# Patient Record
Sex: Female | Born: 1960 | Race: White | Hispanic: No | Marital: Married | Smoking: Never smoker
Health system: Southern US, Community
[De-identification: ages and names within clinical notes are randomized; demographics above are authoritative.]

## PROBLEM LIST (undated history)

## (undated) DIAGNOSIS — K589 Irritable bowel syndrome without diarrhea: Secondary | ICD-10-CM

## (undated) DIAGNOSIS — D219 Benign neoplasm of connective and other soft tissue, unspecified: Secondary | ICD-10-CM

## (undated) DIAGNOSIS — E119 Type 2 diabetes mellitus without complications: Secondary | ICD-10-CM

## (undated) DIAGNOSIS — Z8669 Personal history of other diseases of the nervous system and sense organs: Secondary | ICD-10-CM

## (undated) DIAGNOSIS — T7840XA Allergy, unspecified, initial encounter: Secondary | ICD-10-CM

## (undated) DIAGNOSIS — I10 Essential (primary) hypertension: Secondary | ICD-10-CM

## (undated) DIAGNOSIS — M549 Dorsalgia, unspecified: Secondary | ICD-10-CM

## (undated) DIAGNOSIS — M542 Cervicalgia: Secondary | ICD-10-CM

## (undated) DIAGNOSIS — G5603 Carpal tunnel syndrome, bilateral upper limbs: Secondary | ICD-10-CM

## (undated) DIAGNOSIS — K635 Polyp of colon: Secondary | ICD-10-CM

## (undated) HISTORY — DX: Dorsalgia, unspecified: M54.9

## (undated) HISTORY — DX: Allergy, unspecified, initial encounter: T78.40XA

## (undated) HISTORY — DX: Type 2 diabetes mellitus without complications: E11.9

## (undated) HISTORY — PX: TUBAL LIGATION: SHX77

## (undated) HISTORY — DX: Cervicalgia: M54.2

## (undated) HISTORY — DX: Benign neoplasm of connective and other soft tissue, unspecified: D21.9

## (undated) HISTORY — DX: Polyp of colon: K63.5

## (undated) HISTORY — DX: Irritable bowel syndrome, unspecified: K58.9

## (undated) HISTORY — PX: MRI: SHX5353

## (undated) HISTORY — DX: Essential (primary) hypertension: I10

## (undated) HISTORY — DX: Carpal tunnel syndrome, bilateral upper limbs: G56.03

## (undated) HISTORY — DX: Personal history of other diseases of the nervous system and sense organs: Z86.69

---

## 2003-11-28 ENCOUNTER — Encounter: Payer: Self-pay | Admitting: Family Medicine

## 2003-12-29 ENCOUNTER — Emergency Department (HOSPITAL_COMMUNITY): Admission: EM | Admit: 2003-12-29 | Discharge: 2003-12-29 | Payer: Self-pay | Admitting: Emergency Medicine

## 2005-02-19 ENCOUNTER — Ambulatory Visit: Payer: Self-pay | Admitting: Family Medicine

## 2005-02-28 ENCOUNTER — Ambulatory Visit: Payer: Self-pay | Admitting: Internal Medicine

## 2005-03-07 ENCOUNTER — Ambulatory Visit: Payer: Self-pay | Admitting: Family Medicine

## 2005-03-21 ENCOUNTER — Ambulatory Visit: Payer: Self-pay | Admitting: Internal Medicine

## 2005-03-28 ENCOUNTER — Encounter (INDEPENDENT_AMBULATORY_CARE_PROVIDER_SITE_OTHER): Payer: Self-pay | Admitting: *Deleted

## 2005-03-28 ENCOUNTER — Ambulatory Visit: Payer: Self-pay | Admitting: Internal Medicine

## 2005-08-14 ENCOUNTER — Ambulatory Visit: Payer: Self-pay | Admitting: Family Medicine

## 2005-11-14 ENCOUNTER — Ambulatory Visit: Payer: Self-pay | Admitting: Family Medicine

## 2007-07-20 ENCOUNTER — Encounter: Payer: Self-pay | Admitting: Family Medicine

## 2007-07-20 DIAGNOSIS — G43909 Migraine, unspecified, not intractable, without status migrainosus: Secondary | ICD-10-CM | POA: Insufficient documentation

## 2007-07-20 DIAGNOSIS — K589 Irritable bowel syndrome without diarrhea: Secondary | ICD-10-CM | POA: Insufficient documentation

## 2007-07-21 ENCOUNTER — Ambulatory Visit: Payer: Self-pay | Admitting: Family Medicine

## 2007-07-21 DIAGNOSIS — M546 Pain in thoracic spine: Secondary | ICD-10-CM | POA: Insufficient documentation

## 2007-07-21 DIAGNOSIS — M542 Cervicalgia: Secondary | ICD-10-CM | POA: Insufficient documentation

## 2007-09-21 ENCOUNTER — Telehealth (INDEPENDENT_AMBULATORY_CARE_PROVIDER_SITE_OTHER): Payer: Self-pay | Admitting: *Deleted

## 2008-01-13 ENCOUNTER — Ambulatory Visit: Payer: Self-pay | Admitting: Family Medicine

## 2008-01-13 DIAGNOSIS — K3189 Other diseases of stomach and duodenum: Secondary | ICD-10-CM | POA: Insufficient documentation

## 2008-01-13 DIAGNOSIS — R1013 Epigastric pain: Secondary | ICD-10-CM

## 2008-07-07 ENCOUNTER — Ambulatory Visit: Payer: Self-pay | Admitting: Family Medicine

## 2008-07-07 DIAGNOSIS — E785 Hyperlipidemia, unspecified: Secondary | ICD-10-CM | POA: Insufficient documentation

## 2008-07-21 ENCOUNTER — Ambulatory Visit: Payer: Self-pay | Admitting: Family Medicine

## 2008-07-24 LAB — CONVERTED CEMR LAB
ALT: 49 units/L — ABNORMAL HIGH (ref 0–35)
Basophils Absolute: 0 10*3/uL (ref 0.0–0.1)
Basophils Relative: 0.1 % (ref 0.0–3.0)
Bilirubin, Direct: 0.1 mg/dL (ref 0.0–0.3)
CO2: 31 meq/L (ref 19–32)
Calcium: 9.4 mg/dL (ref 8.4–10.5)
Chloride: 104 meq/L (ref 96–112)
Cholesterol: 235 mg/dL (ref 0–200)
GFR calc Af Amer: 76 mL/min
Glucose, Bld: 108 mg/dL — ABNORMAL HIGH (ref 70–99)
HCT: 41.3 % (ref 36.0–46.0)
Lymphocytes Relative: 42.8 % (ref 12.0–46.0)
Neutro Abs: 3.2 10*3/uL (ref 1.4–7.7)
RBC: 4.59 M/uL (ref 3.87–5.11)
Sodium: 141 meq/L (ref 135–145)
TSH: 2.6 microintl units/mL (ref 0.35–5.50)
Triglycerides: 150 mg/dL — ABNORMAL HIGH (ref 0–149)
VLDL: 30 mg/dL (ref 0–40)
WBC: 6.5 10*3/uL (ref 4.5–10.5)

## 2008-08-11 ENCOUNTER — Ambulatory Visit: Payer: Self-pay | Admitting: Family Medicine

## 2008-08-11 DIAGNOSIS — E6609 Other obesity due to excess calories: Secondary | ICD-10-CM | POA: Insufficient documentation

## 2008-08-11 DIAGNOSIS — E66812 Obesity, class 2: Secondary | ICD-10-CM | POA: Insufficient documentation

## 2008-08-11 DIAGNOSIS — R002 Palpitations: Secondary | ICD-10-CM | POA: Insufficient documentation

## 2008-08-11 DIAGNOSIS — E669 Obesity, unspecified: Secondary | ICD-10-CM

## 2008-08-11 DIAGNOSIS — E66811 Obesity, class 1: Secondary | ICD-10-CM | POA: Insufficient documentation

## 2008-08-11 DIAGNOSIS — K219 Gastro-esophageal reflux disease without esophagitis: Secondary | ICD-10-CM | POA: Insufficient documentation

## 2008-10-25 ENCOUNTER — Ambulatory Visit: Payer: Self-pay | Admitting: Family Medicine

## 2008-10-26 LAB — CONVERTED CEMR LAB
AST: 35 units/L (ref 0–37)
HDL: 37.3 mg/dL — ABNORMAL LOW (ref >39.0)
Triglycerides: 154 mg/dL — ABNORMAL HIGH (ref 0–149)

## 2008-11-01 ENCOUNTER — Ambulatory Visit: Payer: Self-pay | Admitting: Family Medicine

## 2008-11-01 DIAGNOSIS — M25569 Pain in unspecified knee: Secondary | ICD-10-CM | POA: Insufficient documentation

## 2008-11-01 DIAGNOSIS — I10 Essential (primary) hypertension: Secondary | ICD-10-CM | POA: Insufficient documentation

## 2008-11-29 ENCOUNTER — Encounter: Admission: RE | Admit: 2008-11-29 | Discharge: 2008-11-29 | Payer: Self-pay | Admitting: Family Medicine

## 2008-11-29 ENCOUNTER — Ambulatory Visit: Payer: Self-pay | Admitting: Family Medicine

## 2008-11-29 DIAGNOSIS — IMO0002 Reserved for concepts with insufficient information to code with codable children: Secondary | ICD-10-CM | POA: Insufficient documentation

## 2008-11-30 ENCOUNTER — Encounter (INDEPENDENT_AMBULATORY_CARE_PROVIDER_SITE_OTHER): Payer: Self-pay | Admitting: *Deleted

## 2008-12-25 ENCOUNTER — Ambulatory Visit: Payer: Self-pay | Admitting: Family Medicine

## 2009-01-04 LAB — CONVERTED CEMR LAB
ALT: 56 units/L — ABNORMAL HIGH (ref 0–35)
Albumin: 4.1 g/dL (ref 3.5–5.2)
Bilirubin, Direct: 0.1 mg/dL (ref 0.0–0.3)
Direct LDL: 175.2 mg/dL
Total CHOL/HDL Ratio: 7
Triglycerides: 189 mg/dL — ABNORMAL HIGH (ref 0.0–149.0)

## 2009-01-12 ENCOUNTER — Telehealth: Payer: Self-pay | Admitting: Family Medicine

## 2009-01-15 ENCOUNTER — Ambulatory Visit: Payer: Self-pay | Admitting: Family Medicine

## 2009-01-22 ENCOUNTER — Encounter: Payer: Self-pay | Admitting: Family Medicine

## 2009-02-01 ENCOUNTER — Ambulatory Visit: Payer: Self-pay | Admitting: Family Medicine

## 2009-02-02 LAB — CONVERTED CEMR LAB
AST: 31 units/L (ref 0–37)
Cholesterol: 162 mg/dL (ref 0–200)
LDL Cholesterol: 87 mg/dL (ref 0–99)
Total CHOL/HDL Ratio: 5

## 2009-02-05 ENCOUNTER — Ambulatory Visit: Payer: Self-pay | Admitting: Family Medicine

## 2009-03-12 ENCOUNTER — Encounter: Payer: Self-pay | Admitting: Family Medicine

## 2009-05-08 ENCOUNTER — Ambulatory Visit: Payer: Self-pay | Admitting: Family Medicine

## 2009-05-10 LAB — CONVERTED CEMR LAB
AST: 27 units/L (ref 0–37)
HDL: 36.2 mg/dL — ABNORMAL LOW (ref >39.00)
LDL Cholesterol: 93 mg/dL (ref 0–99)

## 2009-11-29 ENCOUNTER — Ambulatory Visit: Payer: Self-pay | Admitting: Family Medicine

## 2009-11-30 LAB — CONVERTED CEMR LAB
ALT: 47 units/L — ABNORMAL HIGH (ref 0–35)
Cholesterol: 163 mg/dL (ref 0–200)
HDL: 47 mg/dL (ref >39.00)
LDL Cholesterol: 79 mg/dL (ref 0–99)
Total CHOL/HDL Ratio: 3

## 2010-01-21 ENCOUNTER — Telehealth: Payer: Self-pay | Admitting: Family Medicine

## 2010-03-07 ENCOUNTER — Encounter (INDEPENDENT_AMBULATORY_CARE_PROVIDER_SITE_OTHER): Payer: Self-pay | Admitting: *Deleted

## 2010-10-29 NOTE — Letter (Signed)
Summary: Colonoscopy Letter  [facility]  [address] [place], [place] [address]   Phone: [phone]  Fax: [phone]      March 07, 2010 MRN: [id number]   GRACIANA SESSA [address] [place], [place]  [address]   Dear Ms. FOSTER,   According to your medical record, it is time for you to schedule a Colonoscopy. The American Cancer Society recommends this procedure as a method to detect early colon cancer. Patients with a family history of colon cancer, or a personal history of colon polyps or inflammatory bowel disease are at increased risk.  This letter has beeen generated based on the recommendations made at the time of your procedure. If you feel that in your particular situation this may no longer apply, please contact our office.  Please call our office at [phone] to schedule this appointment or to update your records at your earliest convenience.  Thank you for cooperating with [place] to provide you with the very best care possible.   Sincerely,    Iva Boop, M.D.  [facility] [phone]

## 2010-10-29 NOTE — Progress Notes (Signed)
Summary: Imipramine 25mg  and Gabapentin 300mg  refill  Phone Note Refill Request Call back at [phone] Message from:  [facility] on January 21, 2010 9:48 AM  Refills Requested: Medication #1:  IMIPRAMINE HCL 25 MG  TABS 2 by mouth at bedtime   Last Refilled: 12/27/2009  Medication #2:  GABAPENTIN 300 MG  CAPS 2 by mouth three times a day.   Last Refilled: 12/14/2009 [facility] request refills electronically for above meds. Please advise.    Method Requested: Telephone to Pharmacy Initial call taken by: Lewanda Rife LPN,  January 21, 2010 9:49 AM  Follow-up for Phone Call        px written on EMR for call in  Follow-up by: Judith Part MD,  January 21, 2010 12:45 PM  Additional Follow-up for Phone Call Additional follow up Details #1::        Medication phoned to [facility] pharmacy as instructed. Lewanda Rife LPN  January 21, 2010 12:58 PM     New/Updated Medications: IMIPRAMINE HCL 25 MG  TABS (IMIPRAMINE HCL) 2 by mouth at bedtime GABAPENTIN 300 MG  CAPS (GABAPENTIN) 2 by mouth three times a day Prescriptions: GABAPENTIN 300 MG  CAPS (GABAPENTIN) 2 by mouth three times a day  #180 x 11   Entered and Authorized by:   Judith Part MD   Signed by:   Lewanda Rife LPN on 45/40/9811   Method used:   Telephoned to ...       [facility] [address] [address] [phone]* (retail)       [address] [place], [place]  [id number]       Ph: [phone]       Fax: [phone]   RxID:   [id number] IMIPRAMINE HCL 25 MG  TABS (IMIPRAMINE HCL) 2 by mouth at bedtime  #60 x 11   Entered and Authorized by:   Judith Part MD   Signed by:   Lewanda Rife LPN on 25/95/6387   Method used:   Telephoned to ...       [facility] [address] [phone]* (retail)       [address] [place], [place]  [id number]       Ph: [phone]       Fax: [phone]   RxID:   [id number]

## 2011-01-23 ENCOUNTER — Other Ambulatory Visit: Payer: Self-pay | Admitting: Family Medicine

## 2011-01-24 NOTE — Telephone Encounter (Signed)
Pt needs to call for appt. 

## 2011-02-26 ENCOUNTER — Other Ambulatory Visit: Payer: Self-pay | Admitting: Family Medicine

## 2011-02-26 NOTE — Telephone Encounter (Signed)
Medication phoned to [facility] [address] pharmacy as instructed.

## 2011-02-26 NOTE — Telephone Encounter (Signed)
Px written for call in   

## 2011-03-29 ENCOUNTER — Encounter: Payer: Self-pay | Admitting: Family Medicine

## 2011-03-31 ENCOUNTER — Encounter: Payer: Self-pay | Admitting: Family Medicine

## 2011-03-31 ENCOUNTER — Ambulatory Visit (INDEPENDENT_AMBULATORY_CARE_PROVIDER_SITE_OTHER): Payer: 59 | Admitting: Family Medicine

## 2011-03-31 DIAGNOSIS — L259 Unspecified contact dermatitis, unspecified cause: Secondary | ICD-10-CM

## 2011-03-31 DIAGNOSIS — I10 Essential (primary) hypertension: Secondary | ICD-10-CM

## 2011-03-31 DIAGNOSIS — K219 Gastro-esophageal reflux disease without esophagitis: Secondary | ICD-10-CM

## 2011-03-31 DIAGNOSIS — L309 Dermatitis, unspecified: Secondary | ICD-10-CM | POA: Insufficient documentation

## 2011-03-31 DIAGNOSIS — E785 Hyperlipidemia, unspecified: Secondary | ICD-10-CM

## 2011-03-31 LAB — CBC WITH DIFFERENTIAL/PLATELET
Basophils Absolute: 0 10*3/uL (ref 0.0–0.1)
Basophils Relative: 0.2 % (ref 0.0–3.0)
Eosinophils Absolute: 0 10*3/uL (ref 0.0–0.7)
HCT: 42.1 % (ref 36.0–46.0)
Hemoglobin: 14.4 g/dL (ref 12.0–15.0)
Lymphocytes Relative: 42.1 % (ref 12.0–46.0)
Lymphs Abs: 2.9 10*3/uL (ref 0.7–4.0)
MCV: 91.1 fl (ref 78.0–100.0)
Monocytes Absolute: 0.5 10*3/uL (ref 0.1–1.0)
RDW: 13 % (ref 11.5–14.6)
WBC: 6.8 10*3/uL (ref 4.5–10.5)

## 2011-03-31 LAB — LIPID PANEL
HDL: 42.7 mg/dL (ref >39.00)
Total CHOL/HDL Ratio: 5
Triglycerides: 312 mg/dL — ABNORMAL HIGH (ref 0.0–149.0)
VLDL: 62.4 mg/dL — ABNORMAL HIGH (ref 0.0–40.0)

## 2011-03-31 LAB — COMPREHENSIVE METABOLIC PANEL
Alkaline Phosphatase: 86 U/L (ref 39–117)
GFR: 70.21 mL/min (ref >60.00)
Sodium: 141 mEq/L (ref 135–145)
Total Protein: 7.3 g/dL (ref 6.0–8.3)

## 2011-03-31 LAB — TSH: TSH: 2.72 u[IU]/mL (ref 0.35–5.50)

## 2011-03-31 MED ORDER — OMEPRAZOLE 20 MG PO CPDR: 20.0000 mg | DELAYED_RELEASE_CAPSULE | Freq: Every day | ORAL | Status: DC

## 2011-03-31 MED ORDER — MOMETASONE FUROATE 0.1 % EX CREA: TOPICAL_CREAM | Freq: Every day | CUTANEOUS | Status: AC

## 2011-03-31 MED ORDER — LISINOPRIL-HYDROCHLOROTHIAZIDE 10-12.5 MG PO TABS: 1.0000 | ORAL_TABLET | ORAL | Status: DC

## 2011-03-31 MED ORDER — IMIPRAMINE HCL 25 MG PO TABS: 50.0000 mg | ORAL_TABLET | Freq: Every day | ORAL | Status: DC

## 2011-03-31 MED ORDER — GABAPENTIN 300 MG PO CAPS: 600.0000 mg | ORAL_CAPSULE | Freq: Three times a day (TID) | ORAL | Status: DC

## 2011-03-31 NOTE — Progress Notes (Signed)
Subjective:    Patient ID: Jo Lamb, female    DOB: 10-27-1960, 50 y.o.   MRN: [id number]  HPI Rash on hands -- thought at first it was poison oak  Used product otc - calamine lotion and cortisone 10 plus/ aaveno ezcema lotion No better and no worse  Skin is rough  Little bumps come up and -rough skin in general   No exp to scabies known  No travel Not intensely itchy   Stopped her chol med on purpose  Her family members had trouble with it - though she had none   bp isgood at 130/82 No problems  No cp or edema or palpitations   Needs chol checked  Is trying to eat healthy -- eats chicken breast and veg a lot  occ bacon and egg - on english muffin - this is her downfall  Patient Active Problem List  Diagnoses  . UNSPEC DISORDER CARBOHYDRATE TRANSPORT&METAB  . HYPERLIPIDEMIA  . OBESITY  . MIGRAINE HEADACHE  . ESSENTIAL HYPERTENSION  . GERD  . DYSPEPSIA  . IBS  . KNEE PAIN, BILATERAL  . NECK PAIN, CHRONIC  . BACK PAIN, THORACIC REGION  . BACK PAIN WITH RADICULOPATHY  . PALPITATIONS  . Hand dermatitis   Past Medical History  Diagnosis Date  . Colon polyps     colonoscopy- polyps, hemorrhoids, divertic  . History of migraine headaches   . Back pain   . Neck pain   . Hypertension     White coat  . IBS (irritable bowel syndrome)   . Fibroids   . Carpal tunnel syndrome on both sides 02/2004// 10/22/2004    Nerve conduction study x's 2   Past Surgical History  Procedure Date  . Vaginal delivery     x's two  . Tubal ligation   . Mri     MRI of head// Negative   History  Substance Use Topics  . Smoking status: Never Smoker   . Smokeless tobacco: Not on file  . Alcohol Use: No   Family History  Problem Relation Age of Onset  . Hypertension Father    No Known Allergies Current Outpatient Prescriptions on File Prior to Visit  Medication Sig Dispense Refill  . Omega-3 Fatty Acids (FISH OIL) 1000 MG CAPS Take 1 capsule by mouth daily.                Review of Systems Review of Systems  Constitutional: Negative for fever, appetite change, fatigue and unexpected weight change.  Eyes: Negative for pain and visual disturbance.  Respiratory: Negative for cough and shortness of breath.   Cardiovascular: Negative.  no cp or sob or edema  Gastrointestinal: Negative for nausea, diarrhea and constipation.  Genitourinary: Negative for urgency and frequency.  Skin: Negative for pallor. pos for itchy rash Neurological: Negative for weakness, light-headedness, numbness and headaches.  Hematological: Negative for adenopathy. Does not bruise/bleed easily.  Psychiatric/Behavioral: Negative for dysphoric mood. The patient is not nervous/anxious.         Objective:   Physical Exam  Constitutional: She appears well-developed and well-nourished. No distress.  HENT:  Head: Normocephalic and atraumatic.  Mouth/Throat: Oropharynx is clear and moist.  Eyes: Conjunctivae and EOM are normal. Pupils are equal, round, and reactive to light.  Neck: Normal range of motion. Neck supple. No JVD present. Carotid bruit is not present. No thyromegaly present.  Cardiovascular: Normal rate, regular rhythm and normal heart sounds.   Pulmonary/Chest: Effort normal and breath sounds normal.  No respiratory distress. She has no rales.  Abdominal: Soft. Bowel sounds are normal. She exhibits no distension and no mass. There is no tenderness.  Musculoskeletal: Normal range of motion. She exhibits no edema and no tenderness.  Lymphadenopathy:    She has no cervical adenopathy.  Neurological: She is alert. She has normal reflexes. She displays normal reflexes. No cranial nerve deficit.  Skin: Skin is warm and dry. No rash noted. No erythema.       Rash on proximal thumbs - sparing web spaces/ and palms- dry and flaky with slt erythema  No vesicles  Psychiatric: She has a normal mood and affect.          Assessment & Plan:

## 2011-03-31 NOTE — Assessment & Plan Note (Signed)
Mainly on inner thumbs Resembles eczema  Disc soap - gentle / avoid hot water Trial of elocon cream and update

## 2011-03-31 NOTE — Assessment & Plan Note (Signed)
Better on 2nd check today and well controlled at home refil med Lab today Disc exercise and need for wt loss

## 2011-03-31 NOTE — Assessment & Plan Note (Signed)
Pt decided to stop zocor out of fear of all chol meds  Lab today Diet fair Disc low sat fat diet in detail

## 2011-03-31 NOTE — Patient Instructions (Signed)
Labs today  No change in medicines  Use the elocon cream on hands - and update me in 2 weeks if not improving  Try dishwashing gloves " true blue" Use dove soap for sensitive skin  Use lubriderm or eucerin moisturizer

## 2011-04-01 LAB — LDL CHOLESTEROL, DIRECT: Direct LDL: 151.2 mg/dL

## 2011-04-10 ENCOUNTER — Telehealth: Payer: Self-pay

## 2011-04-12 NOTE — Telephone Encounter (Signed)
Opened in error

## 2011-04-16 ENCOUNTER — Encounter: Payer: Self-pay | Admitting: Family Medicine

## 2011-04-16 ENCOUNTER — Ambulatory Visit (INDEPENDENT_AMBULATORY_CARE_PROVIDER_SITE_OTHER): Payer: 59 | Admitting: Family Medicine

## 2011-04-16 VITALS — BP 130/78 | HR 84 | Temp 98.5°F | Wt 161.4 lb

## 2011-04-16 DIAGNOSIS — J029 Acute pharyngitis, unspecified: Secondary | ICD-10-CM

## 2011-04-16 LAB — POCT RAPID STREP A (OFFICE): Rapid Strep A Screen: NEGATIVE

## 2011-04-16 NOTE — Progress Notes (Signed)
  Subjective:    Patient ID: Jo Lamb, female    DOB: [birth year], 50 y.o.   MRN: [id number]  HPI CC: ST  1 wk h/o ST worse yesterday than today, but feeling malaise.  R sided, pain down neck on inside.  Got worse yesterday.  This am had golden/brown sputum from cough but no more.  + mild HA but tends to get headaches.  Mild sinus pressure right nasal.  Did have white spot in throat yesterday.  No cough, fevers/chills, abd pain, n/v.  Denies sinus congestion  No sick contacts at home, no smokers at home, no asthma, COPD.  Review of Systems Per HPI    Objective:   Physical Exam  Vitals reviewed. Constitutional: She appears well-developed and well-nourished. No distress.  HENT:  Head: Normocephalic and atraumatic.  Right Ear: Hearing, tympanic membrane, external ear and ear canal normal.  Left Ear: Hearing, tympanic membrane, external ear and ear canal normal.  Nose: No mucosal edema or rhinorrhea. Right sinus exhibits no maxillary sinus tenderness and no frontal sinus tenderness. Left sinus exhibits no maxillary sinus tenderness and no frontal sinus tenderness.  Mouth/Throat: Uvula is midline and mucous membranes are normal. Posterior oropharyngeal erythema present. No oropharyngeal exudate, posterior oropharyngeal edema or tonsillar abscesses.       R maxillary sinus pressure  Eyes: Conjunctivae and EOM are normal. Pupils are equal, round, and reactive to light. No scleral icterus.  Neck: Normal range of motion. Neck supple.  Cardiovascular: Normal rate, regular rhythm, normal heart sounds and intact distal pulses.   No murmur heard. Pulmonary/Chest: Effort normal and breath sounds normal. No respiratory distress. She has no wheezes. She has no rales.  Lymphadenopathy:    She has no cervical adenopathy.  Skin: Skin is warm and dry. No rash noted.          Assessment & Plan:

## 2011-04-16 NOTE — Patient Instructions (Signed)
You have pharyngitis, viral. Push fluids and plenty of rest. May use ibuprofen for throat inflammation. Salt water gargles. Suck on cold things like popsicles or warm things like herbal teas (whichever soothes the throat better). Return if fever >101.5, worsening pain, or trouble opening/closing mouth, or hoarse voice. Good to see you today, call clinic with questions. 

## 2011-07-08 ENCOUNTER — Telehealth: Payer: Self-pay | Admitting: Gastroenterology

## 2011-07-08 NOTE — Telephone Encounter (Signed)
Talked with patient about scheduling her Colonoscopy. Patient stated that her father died not to long ago and that she just went back to work and she could not afford to get off of work right now but she would call back to schedule Colon when she could.

## 2011-09-29 ENCOUNTER — Telehealth: Payer: Self-pay | Admitting: Internal Medicine

## 2011-09-29 NOTE — Telephone Encounter (Signed)
Patient notified as instructed by telephone. 

## 2011-09-29 NOTE — Telephone Encounter (Signed)
Needs to go to urgent care if we are full-esp since tomorrow is a holiday

## 2011-09-29 NOTE — Telephone Encounter (Signed)
Patient thinks she has an UTI with burning and blood, urgency to go.  No appointment available would like to know if you could call in something or if she could drop off a specimen or should she go to urgent care.  Please advise.

## 2011-10-01 ENCOUNTER — Telehealth: Payer: Self-pay

## 2011-10-01 NOTE — Telephone Encounter (Signed)
Pt advised 09/29/11 to go to urgent care possible UTI. Pt said UC was too expensive and has been taking OTC med so burning upon urination is better but not gone. Pt wants appt only with Dr Milinda Antis. Scheduled 10/03/11 at 3:15. Pt instructed to call back if symptoms worsen before seen.

## 2011-10-03 ENCOUNTER — Encounter: Payer: Self-pay | Admitting: Family Medicine

## 2011-10-03 ENCOUNTER — Ambulatory Visit (INDEPENDENT_AMBULATORY_CARE_PROVIDER_SITE_OTHER): Payer: 59 | Admitting: Family Medicine

## 2011-10-03 VITALS — BP 140/90 | HR 88 | Temp 97.9°F | Ht 58.5 in | Wt 160.2 lb

## 2011-10-03 DIAGNOSIS — R350 Frequency of micturition: Secondary | ICD-10-CM

## 2011-10-03 DIAGNOSIS — R300 Dysuria: Secondary | ICD-10-CM | POA: Insufficient documentation

## 2011-10-03 DIAGNOSIS — Z23 Encounter for immunization: Secondary | ICD-10-CM

## 2011-10-03 LAB — POCT URINALYSIS DIPSTICK
Bilirubin, UA: NEGATIVE
Ketones, UA: NEGATIVE
Spec Grav, UA: 1.015
Urobilinogen, UA: 0.2

## 2011-10-03 LAB — POCT UA - MICROSCOPIC ONLY
Bacteria, U Microscopic: 0
WBC, Ur, HPF, POC: 0

## 2011-10-03 NOTE — Assessment & Plan Note (Signed)
With neg ua and dip today No vaginal symptoms  Puzzling symptoms - ? If caffeine could have caused some bladder or urethral irritation Adv fluids- water only  Avoid baths See AVS cx urine and update  If neg - and symptoms continue - would consider urology eval

## 2011-10-03 NOTE — Patient Instructions (Signed)
Drink lots of water Hold off on other beverages - esp soda/tea/ coffee  Avoid bubble baths  Azo is ok if it helps If symptoms get severe over the weekend call this number to talk to a nurse I will inform you as soon as culture returns  Flu shot today

## 2011-10-03 NOTE — Progress Notes (Signed)
Subjective:    Patient ID: Jo Lamb, female    DOB: 1961/01/01, 51 y.o.   MRN: [id number]  HPI Here for urinary symptoms  Started a week before xmas- a little burning to urinate  Then vomited once in the middle of the night Got some better Friday - had much worse pain and a little blood in urine  Got better and then worse Sunday again  Azo helped a bit - did not use today   Not feeling bad today  Not any frequency or urgency today  Drinking lots of water and cranberry juice   No fever  A little nausea yesterday (had skipped lunch )   No severe back or flank pain    ua is neg dip and spin today No hx kidney stones   (there is in family)  Patient Active Problem List  Diagnoses  . UNSPEC DISORDER CARBOHYDRATE TRANSPORT&METAB  . HYPERLIPIDEMIA  . OBESITY  . MIGRAINE HEADACHE  . ESSENTIAL HYPERTENSION  . GERD  . DYSPEPSIA  . IBS  . KNEE PAIN, BILATERAL  . NECK PAIN, CHRONIC  . BACK PAIN, THORACIC REGION  . BACK PAIN WITH RADICULOPATHY  . PALPITATIONS  . Hand dermatitis   Past Medical History  Diagnosis Date  . Colon polyps     colonoscopy- polyps, hemorrhoids, divertic  . History of migraine headaches   . Back pain   . Neck pain   . Hypertension     White coat  . IBS (irritable bowel syndrome)   . Fibroids   . Carpal tunnel syndrome on both sides 02/2004// 10/22/2004    Nerve conduction study x's 2   Past Surgical History  Procedure Date  . Vaginal delivery     x's two  . Tubal ligation   . Mri     MRI of head// Negative   History  Substance Use Topics  . Smoking status: Never Smoker   . Smokeless tobacco: Not on file  . Alcohol Use: No   Family History  Problem Relation Age of Onset  . Hypertension Father    No Known Allergies Current Outpatient Prescriptions on File Prior to Visit  Medication Sig Dispense Refill  . B Complex Vitamins LIQD Place 1 mL under the tongue daily.        Marland Kitchen gabapentin (NEURONTIN) 300 MG capsule Take 2  capsules (600 mg total) by mouth 3 (three) times daily.  180 capsule  11  . imipramine (TOFRANIL) 25 MG tablet Take 2 tablets (50 mg total) by mouth at bedtime.  60 tablet  11  . lisinopril-hydrochlorothiazide (PRINZIDE,ZESTORETIC) 10-12.5 MG per tablet Take 1 tablet by mouth every morning.  30 tablet  11  . mometasone (ELOCON) 0.1 % cream Apply topically daily. As needed for hand rash  45 g  0  . Omega-3 Fatty Acids (FISH OIL) 1000 MG CAPS Take 1 capsule by mouth daily.        Marland Kitchen omeprazole (PRILOSEC) 20 MG capsule Take 1 capsule (20 mg total) by mouth daily. 1 by mouth each am about 30 minutes before breakfast  30 capsule  11  . riboflavin (VITAMIN B-2) 100 MG TABS Take 100 mg by mouth daily.            Review of Systems Review of Systems  Constitutional: Negative for fever, appetite change, fatigue and unexpected weight change.  Eyes: Negative for pain and visual disturbance.  Respiratory: Negative for cough and shortness of breath.   Cardiovascular: Negative for  cp or palpitations    Gastrointestinal: Negative for nausea, diarrhea and constipation.  Genitourinary: was pos for urgency/freq/ dysuria (but negative for those symptoms today) Skin: Negative for pallor or rash   Neurological: Negative for weakness, light-headedness, numbness and headaches.  Hematological: Negative for adenopathy. Does not bruise/bleed easily.  Psychiatric/Behavioral: Negative for dysphoric mood. The patient is not nervous/anxious.          Objective:   Physical Exam  Constitutional: She appears well-developed and well-nourished. No distress.       overwt and well appearing   HENT:  Head: Normocephalic and atraumatic.  Mouth/Throat: Oropharynx is clear and moist.  Eyes: Conjunctivae and EOM are normal. Pupils are equal, round, and reactive to light.  Neck: Normal range of motion. Neck supple.  Cardiovascular: Normal rate, regular rhythm and normal heart sounds.   Pulmonary/Chest: Effort normal and  breath sounds normal. No respiratory distress. She has no wheezes.  Abdominal: Soft. Bowel sounds are normal. She exhibits no distension and no mass. There is tenderness. There is no rebound and no guarding.       Suprapubic tenderness noted   Musculoskeletal: She exhibits no edema and no tenderness.       No cva tenderness  Lymphadenopathy:    She has no cervical adenopathy.  Neurological: She is alert.  Skin: Skin is warm and dry. No rash noted. No erythema. No pallor.  Psychiatric: She has a normal mood and affect.          Assessment & Plan:

## 2011-10-08 ENCOUNTER — Telehealth: Payer: Self-pay | Admitting: Internal Medicine

## 2011-10-08 NOTE — Telephone Encounter (Signed)
Informed patient of results and she did say that her symptoms are much better but if she has any more problems she will give [place] a call.

## 2011-10-08 NOTE — Telephone Encounter (Signed)
Message copied by Virgina Evener on Wed Oct 08, 2011 10:38 AM ------      Message from: Roxy Manns A      Created: Sun Oct 05, 2011 12:46 PM       Urine cx is neg for infection      If symptoms are not improved I will refer her to urology for eval- please let me know

## 2011-11-27 ENCOUNTER — Telehealth: Payer: Self-pay | Admitting: Family Medicine

## 2011-11-27 NOTE — Telephone Encounter (Signed)
Patient needs a letter for her insurance stating that Dr.Tower treats you for hypertension and migraines.  The fax number to send the letter is [phone].

## 2011-11-28 NOTE — Telephone Encounter (Signed)
Letter faxed to requested fax number.

## 2011-11-28 NOTE — Telephone Encounter (Signed)
Letter is in IN box 

## 2012-03-16 ENCOUNTER — Other Ambulatory Visit: Payer: Self-pay | Admitting: Family Medicine

## 2012-03-16 NOTE — Telephone Encounter (Signed)
Please schedule annual exam (fall is fine) Will refill electronically

## 2012-03-16 NOTE — Telephone Encounter (Signed)
Tofranil request [last refill 07.02.12 #60x11]/SLS Please advise.

## 2012-03-16 NOTE — Telephone Encounter (Signed)
LMOM to Inform patient to schedule OV for annual exam per MAT, Rx done to pharmacy/SLS

## 2012-07-26 ENCOUNTER — Other Ambulatory Visit: Payer: Self-pay | Admitting: Family Medicine

## 2012-07-27 NOTE — Telephone Encounter (Signed)
Please schedule f/u this winter and refil until then, thanks

## 2012-07-27 NOTE — Telephone Encounter (Signed)
Left voicemail requesting pt to call office, will try to call back tomorrow  

## 2012-07-27 NOTE — Telephone Encounter (Signed)
Ok to refill? No recent labs or appt and no future appt

## 2012-07-28 MED ORDER — LISINOPRIL-HYDROCHLOROTHIAZIDE 10-12.5 MG PO TABS: 1.0000 | ORAL_TABLET | ORAL | Status: DC

## 2012-07-28 NOTE — Telephone Encounter (Signed)
Pt called back scheduled appt 08/18/12 at 8 am. Refill gabapentin # 180 x 0 and lisinopril HCTZ #30 x 0 [facility]. Pt notified done while on phone.

## 2012-08-18 ENCOUNTER — Ambulatory Visit (INDEPENDENT_AMBULATORY_CARE_PROVIDER_SITE_OTHER): Payer: PRIVATE HEALTH INSURANCE | Admitting: Family Medicine

## 2012-08-18 ENCOUNTER — Encounter: Payer: Self-pay | Admitting: Family Medicine

## 2012-08-18 VITALS — BP 126/84 | HR 110 | Temp 98.2°F | Ht 58.5 in | Wt 161.2 lb

## 2012-08-18 DIAGNOSIS — E785 Hyperlipidemia, unspecified: Secondary | ICD-10-CM

## 2012-08-18 DIAGNOSIS — Z23 Encounter for immunization: Secondary | ICD-10-CM

## 2012-08-18 DIAGNOSIS — I10 Essential (primary) hypertension: Secondary | ICD-10-CM

## 2012-08-18 LAB — CBC WITH DIFFERENTIAL/PLATELET
Basophils Absolute: 0 10*3/uL (ref 0.0–0.1)
Basophils Relative: 0.4 % (ref 0.0–3.0)
Eosinophils Relative: 0 % (ref 0.0–5.0)
HCT: 45.2 % (ref 36.0–46.0)
Hemoglobin: 14.9 g/dL (ref 12.0–15.0)
Lymphocytes Relative: 36.8 % (ref 12.0–46.0)
Monocytes Relative: 8.4 % (ref 3.0–12.0)
Neutro Abs: 4 10*3/uL (ref 1.4–7.7)
RBC: 4.97 Mil/uL (ref 3.87–5.11)
WBC: 7.3 10*3/uL (ref 4.5–10.5)

## 2012-08-18 LAB — LIPID PANEL: Triglycerides: 244 mg/dL — ABNORMAL HIGH (ref 0.0–149.0)

## 2012-08-18 LAB — LDL CHOLESTEROL, DIRECT: Direct LDL: 193 mg/dL

## 2012-08-18 LAB — COMPREHENSIVE METABOLIC PANEL
ALT: 50 U/L — ABNORMAL HIGH (ref 0–35)
CO2: 31 mEq/L (ref 19–32)
Calcium: 10 mg/dL (ref 8.4–10.5)
Chloride: 98 mEq/L (ref 96–112)
Creatinine, Ser: 1.1 mg/dL (ref 0.4–1.2)
GFR: 57.19 mL/min — ABNORMAL LOW (ref >60.00)
Glucose, Bld: 115 mg/dL — ABNORMAL HIGH (ref 70–99)

## 2012-08-18 LAB — TSH: TSH: 2.76 u[IU]/mL (ref 0.35–5.50)

## 2012-08-18 MED ORDER — GABAPENTIN 300 MG PO CAPS: ORAL_CAPSULE | ORAL | Status: DC

## 2012-08-18 MED ORDER — IMIPRAMINE HCL 25 MG PO TABS: ORAL_TABLET | ORAL | Status: DC

## 2012-08-18 MED ORDER — LISINOPRIL-HYDROCHLOROTHIAZIDE 10-12.5 MG PO TABS: 1.0000 | ORAL_TABLET | ORAL | Status: DC

## 2012-08-18 NOTE — Assessment & Plan Note (Signed)
bp in fair control at this time  No changes needed  Disc lifstyle change with low sodium diet and exercise   Labs today Flu shot today

## 2012-08-18 NOTE — Assessment & Plan Note (Signed)
Pt declines statins Watches diet somewhat  Wants to check it  Disc low sat fat diet - also need for wt loss and exericse

## 2012-08-18 NOTE — Patient Instructions (Addendum)
Flu vaccine today  Labs today bp is good Avoid red meat/ fried foods/ egg yolks/ fatty breakfast meats/ butter, cheese and high fat dairy/ and shellfish   Think about starting an exercise program - it will make you feel good  Make sure to get caught up with your gyn care  Please send for pt's last colonoscopy - Jo Lamb

## 2012-08-18 NOTE — Progress Notes (Signed)
Subjective:    Patient ID: Jo Lamb, female    DOB: [birth year], 51 y.o.   MRN: [id number]  HPI Here for f/u of chronic medical problems   bp is stable today  No cp or palpitations or headaches or edema  No side effects to medicines  BP Readings from Last 3 Encounters:  08/18/12 126/84  10/03/11 140/90  04/16/11 130/78   does not take care of herself like she should   Hx of high chol Was on zocor- became wary of side eff and decided to stop it  She is interested in knowing what it is - probably would not take medicine  Lab Results  Component Value Date   CHOL 226* 03/31/2011   HDL 42.70 03/31/2011   LDLCALC 79 11/29/2009   LDLDIRECT 151.2 03/31/2011   TRIG 312.0* 03/31/2011   CHOLHDL 5 03/31/2011    Diet does not always eat right - but does watch her fats - sugar/ sweets is her weakness  Exercise - has a treadmill/ does not use it -- works 2 jobs    [employer] is up 1 lb with bmi of 33  occ headaches- no more migraines   Flu vaccine- wants to get that today   Td - thinks she may be up to date - she needs to check on that   Mood is good-no signs of depression   Gyn- sees a GYN - has not gone in a few years  Does not go for mammograms - decided not to do them, will discuss this further with her gyn She does self exams     Patient Active Problem List  Diagnosis  . UNSPEC DISORDER CARBOHYDRATE TRANSPORT&METAB  . HYPERLIPIDEMIA  . OBESITY  . MIGRAINE HEADACHE  . ESSENTIAL HYPERTENSION  . GERD  . DYSPEPSIA  . IBS  . KNEE PAIN, BILATERAL  . NECK PAIN, CHRONIC  . BACK PAIN, THORACIC REGION  . BACK PAIN WITH RADICULOPATHY  . PALPITATIONS  . Hand dermatitis  . Dysuria   Past Medical History  Diagnosis Date  . Colon polyps     colonoscopy- polyps, hemorrhoids, divertic  . History of migraine headaches   . Back pain   . Neck pain   . Hypertension     White coat  . IBS (irritable bowel syndrome)   . Fibroids   . Carpal tunnel syndrome on both sides 02/2004//  10/22/2004    Nerve conduction study x's 2   Past Surgical History  Procedure Date  . Vaginal delivery     x's two  . Tubal ligation   . Mri     MRI of head// Negative   History  Substance Use Topics  . Smoking status: Never Smoker   . Smokeless tobacco: Not on file  . Alcohol Use: No   Family History  Problem Relation Age of Onset  . Hypertension Father    No Known Allergies Current Outpatient Prescriptions on File Prior to Visit  Medication Sig Dispense Refill  . gabapentin (NEURONTIN) 300 MG capsule take 2 capsules by mouth three times a day  180 capsule  0  . imipramine (TOFRANIL) 25 MG tablet take 2 tablets by mouth at bedtime  60 tablet  5  . lisinopril-hydrochlorothiazide (PRINZIDE,ZESTORETIC) 10-12.5 MG per tablet Take 1 tablet by mouth every morning.  30 tablet  0    Review of Systems Review of Systems  Constitutional: Negative for fever, appetite change, fatigue and unexpected weight change.  Eyes: Negative for pain  and visual disturbance.  Respiratory: Negative for cough and shortness of breath.   Cardiovascular: Negative for cp or palpitations    Gastrointestinal: Negative for nausea, diarrhea and constipation.  Genitourinary: Negative for urgency and frequency.  Skin: Negative for pallor or rash   Neurological: Negative for weakness, light-headedness, numbness and headaches.  Hematological: Negative for adenopathy. Does not bruise/bleed easily.  Psychiatric/Behavioral: Negative for dysphoric mood. The patient is not nervous/anxious.         Objective:   Physical Exam  Constitutional: She appears well-developed and well-nourished. No distress.       overwt and well appearing  HENT:  Head: Normocephalic and atraumatic.  Right Ear: External ear normal.  Left Ear: External ear normal.  Nose: Nose normal.  Mouth/Throat: Oropharynx is clear and moist.  Eyes: Conjunctivae normal and EOM are normal. Pupils are equal, round, and reactive to light. Right eye  exhibits no discharge. Left eye exhibits no discharge. No scleral icterus.  Neck: Normal range of motion. Neck supple. No JVD present. Carotid bruit is not present. No thyromegaly present.  Cardiovascular: Normal rate, regular rhythm, normal heart sounds and intact distal pulses.  Exam reveals no gallop.   Pulmonary/Chest: Effort normal and breath sounds normal. No respiratory distress. She has no wheezes.  Abdominal: Soft. Bowel sounds are normal. She exhibits no distension, no abdominal bruit and no mass. There is no tenderness.  Musculoskeletal: Normal range of motion. She exhibits no edema and no tenderness.  Lymphadenopathy:    She has no cervical adenopathy.  Neurological: She is alert. She has normal reflexes. No cranial nerve deficit. She exhibits normal muscle tone. Coordination normal.  Skin: Skin is warm and dry. No rash noted. No erythema. No pallor.  Psychiatric: She has a normal mood and affect.          Assessment & Plan:

## 2012-08-19 ENCOUNTER — Encounter: Payer: Self-pay | Admitting: *Deleted

## 2012-11-12 ENCOUNTER — Other Ambulatory Visit: Payer: PRIVATE HEALTH INSURANCE

## 2012-11-14 ENCOUNTER — Telehealth: Payer: Self-pay | Admitting: Family Medicine

## 2012-11-14 DIAGNOSIS — E785 Hyperlipidemia, unspecified: Secondary | ICD-10-CM

## 2012-11-14 DIAGNOSIS — R739 Hyperglycemia, unspecified: Secondary | ICD-10-CM

## 2012-11-14 DIAGNOSIS — R7303 Prediabetes: Secondary | ICD-10-CM | POA: Insufficient documentation

## 2012-11-14 DIAGNOSIS — E119 Type 2 diabetes mellitus without complications: Secondary | ICD-10-CM | POA: Insufficient documentation

## 2012-11-14 DIAGNOSIS — I10 Essential (primary) hypertension: Secondary | ICD-10-CM

## 2012-11-14 NOTE — Telephone Encounter (Signed)
Message copied by Judy Pimple on Sun Nov 14, 2012 11:30 AM ------      Message from: Alvina Chou      Created: Fri Nov 05, 2012 10:35 AM      Regarding: Lab orders for  Friday, 2.14.14       3 month f/u labs ------

## 2012-11-15 ENCOUNTER — Telehealth: Payer: Self-pay | Admitting: Family Medicine

## 2012-11-15 NOTE — Telephone Encounter (Signed)
Message copied by Judy Pimple on Mon Nov 15, 2012  9:21 PM ------      Message from: Alvina Chou      Created: Tue Nov 09, 2012  3:38 PM      Regarding: Lab orders for Tuesday, 2.18.14       Labs for 3 month f/u ------

## 2012-11-16 ENCOUNTER — Other Ambulatory Visit (INDEPENDENT_AMBULATORY_CARE_PROVIDER_SITE_OTHER): Payer: BC Managed Care – PPO

## 2012-11-16 DIAGNOSIS — R739 Hyperglycemia, unspecified: Secondary | ICD-10-CM

## 2012-11-16 DIAGNOSIS — E785 Hyperlipidemia, unspecified: Secondary | ICD-10-CM

## 2012-11-16 DIAGNOSIS — I10 Essential (primary) hypertension: Secondary | ICD-10-CM

## 2012-11-16 DIAGNOSIS — R7309 Other abnormal glucose: Secondary | ICD-10-CM

## 2012-11-16 LAB — LIPID PANEL
HDL: 36.6 mg/dL — ABNORMAL LOW (ref >39.00)
Total CHOL/HDL Ratio: 6
VLDL: 47.4 mg/dL — ABNORMAL HIGH (ref 0.0–40.0)

## 2012-11-16 LAB — COMPREHENSIVE METABOLIC PANEL
ALT: 57 U/L — ABNORMAL HIGH (ref 0–35)
AST: 34 U/L (ref 0–37)
Albumin: 4.4 g/dL (ref 3.5–5.2)
Alkaline Phosphatase: 77 U/L (ref 39–117)
Glucose, Bld: 103 mg/dL — ABNORMAL HIGH (ref 70–99)
Potassium: 4.2 mEq/L (ref 3.5–5.1)
Sodium: 139 mEq/L (ref 135–145)
Total Bilirubin: 0.3 mg/dL (ref 0.3–1.2)
Total Protein: 7.8 g/dL (ref 6.0–8.3)

## 2012-11-16 LAB — LDL CHOLESTEROL, DIRECT: Direct LDL: 142.9 mg/dL

## 2012-11-19 ENCOUNTER — Ambulatory Visit: Payer: PRIVATE HEALTH INSURANCE | Admitting: Family Medicine

## 2012-11-24 ENCOUNTER — Encounter: Payer: Self-pay | Admitting: Family Medicine

## 2012-11-24 ENCOUNTER — Ambulatory Visit (INDEPENDENT_AMBULATORY_CARE_PROVIDER_SITE_OTHER): Payer: BC Managed Care – PPO | Admitting: Family Medicine

## 2012-11-24 VITALS — BP 130/85 | HR 106 | Temp 98.6°F | Ht 58.5 in | Wt 164.8 lb

## 2012-11-24 DIAGNOSIS — I10 Essential (primary) hypertension: Secondary | ICD-10-CM

## 2012-11-24 DIAGNOSIS — R739 Hyperglycemia, unspecified: Secondary | ICD-10-CM

## 2012-11-24 DIAGNOSIS — R7309 Other abnormal glucose: Secondary | ICD-10-CM

## 2012-11-24 DIAGNOSIS — E785 Hyperlipidemia, unspecified: Secondary | ICD-10-CM

## 2012-11-24 NOTE — Assessment & Plan Note (Signed)
bp is stable today  No cp or palpitations or headaches or edema  No side effects to medicines  BP Readings from Last 3 Encounters:  11/24/12 130/85  08/18/12 126/84  10/03/11 140/90    Was better on 2nd check

## 2012-11-24 NOTE — Assessment & Plan Note (Signed)
Lab Results  Component Value Date   HGBA1C 6.0 11/16/2012    Disc getting rid of sweets and strategy for exercise F/u 51mo lab prior Will work on wt loss

## 2012-11-24 NOTE — Assessment & Plan Note (Signed)
Cholesterol improving with diet- not quite at goal Disc goals for lipids and reasons to control them Rev labs with pt Rev low sat fat diet in detail Lab and f/u 3 mo

## 2012-11-24 NOTE — Patient Instructions (Addendum)
Work on cutting sweets as much as possible  Aim for exericse 5 days per week  Sugar is stable and cholesterol is improving - going the right direction  Schedule follow up in 3 months with labs prior

## 2012-11-24 NOTE — Progress Notes (Signed)
Subjective:    Patient ID: Jo Lamb, female    DOB: 1960/12/08, 52 y.o.   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 [employer]    bp is up on first check today  No cp or palpitations or headaches or edema  No side effects to medicines  BP Readings from Last 3 Encounters:  11/24/12 148/94  08/18/12 126/84  10/03/11 140/90      Hyperlipidemia Diet control- declines med Lab Results  Component Value Date   CHOL 232* 11/16/2012   CHOL 275* 08/18/2012   CHOL 226* 03/31/2011   Lab Results  Component Value Date   HDL 36.60* 11/16/2012   HDL 39.30 08/18/2012   HDL 42.70 03/31/2011   Lab Results  Component Value Date   LDLCALC 79 11/29/2009   LDLCALC 93 05/08/2009   LDLCALC 87 02/01/2009   Lab Results  Component Value Date   TRIG 237.0* 11/16/2012   TRIG 244.0* 08/18/2012   TRIG 312.0* 03/31/2011   Lab Results  Component Value Date   CHOLHDL 6 11/16/2012   CHOLHDL 7 08/18/2012   CHOLHDL 5 03/31/2011   Lab Results  Component Value Date   LDLDIRECT 142.9 11/16/2012   LDLDIRECT 193.0 08/18/2012   LDLDIRECT 151.2 03/31/2011  is watching sat fats in diet and this came down more than expected    hypergycemia a1c 6.0 Pt is aware she is prediabetes status  Very difficult for her to avoid sugar She craves it   Patient Active Problem List  Diagnosis  . UNSPEC DISORDER CARBOHYDRATE TRANSPORT&METAB  . HYPERLIPIDEMIA  . OBESITY  . MIGRAINE HEADACHE  . ESSENTIAL HYPERTENSION  . GERD  . DYSPEPSIA  . IBS  . KNEE PAIN, BILATERAL  . NECK PAIN, CHRONIC  . BACK PAIN WITH RADICULOPATHY  . Hand dermatitis  . Hyperglycemia   Past Medical History  Diagnosis Date  . Colon polyps     colonoscopy- polyps,  hemorrhoids, divertic  . History of migraine headaches   . Back pain   . Neck pain   . Hypertension     White coat  . IBS (irritable bowel syndrome)   . Fibroids   . Carpal tunnel syndrome on both sides 02/2004// 10/22/2004    Nerve conduction study x's 2   Past Surgical History  Procedure Laterality Date  . Vaginal delivery      x's two  . Tubal ligation    . Mri      MRI of head// Negative   History  Substance Use Topics  . Smoking status: Never Smoker   . Smokeless tobacco: Not on file  . Alcohol Use: No   Family History  Problem Relation Age of Onset  . Hypertension Father    No Known Allergies Current Outpatient Prescriptions on File Prior to Visit  Medication Sig Dispense Refill  . gabapentin (NEURONTIN) 300 MG capsule Take 2 capsules by mouth three times daily  180 capsule  11  . imipramine (TOFRANIL) 25 MG tablet Take 2 pills by mouth at bedtime  60 tablet  11  . lisinopril-hydrochlorothiazide (PRINZIDE,ZESTORETIC) 10-12.5 MG per tablet Take 1 tablet by mouth every morning.  30 tablet  11   No current facility-administered medications on file prior to visit.     Review of Systems     Objective:   Physical Exam  Constitutional: She appears well-developed and well-nourished. No distress.  overwt and well app  HENT:  Head: Normocephalic and atraumatic.  Mouth/Throat: Oropharynx is clear and moist.  Eyes: Conjunctivae and EOM are normal. Pupils are equal, round, and reactive to light. Right eye exhibits no discharge. Left eye exhibits no discharge.  Neck: Normal range of motion. Neck supple. No JVD present. Carotid bruit is not present. No thyromegaly present.  Cardiovascular: Normal rate, regular rhythm, normal heart sounds and intact distal pulses.  Exam reveals no gallop.   Pulmonary/Chest: Effort normal and breath sounds normal. No respiratory distress. She has no wheezes.  Abdominal: Soft. Bowel sounds are normal. She exhibits no distension, no abdominal  bruit and no mass. There is no tenderness.  Musculoskeletal: She exhibits no edema and no tenderness.  Lymphadenopathy:    She has no cervical adenopathy.  Neurological: She is alert. She has normal reflexes. No cranial nerve deficit. She exhibits normal muscle tone. Coordination normal.  Skin: Skin is warm and dry. No rash noted. No erythema. No pallor.  Psychiatric: She has a normal mood and affect.          Assessment & Plan:

## 2013-02-16 ENCOUNTER — Other Ambulatory Visit (INDEPENDENT_AMBULATORY_CARE_PROVIDER_SITE_OTHER): Payer: BC Managed Care – PPO

## 2013-02-16 DIAGNOSIS — R739 Hyperglycemia, unspecified: Secondary | ICD-10-CM

## 2013-02-16 DIAGNOSIS — E785 Hyperlipidemia, unspecified: Secondary | ICD-10-CM

## 2013-02-16 DIAGNOSIS — R7309 Other abnormal glucose: Secondary | ICD-10-CM

## 2013-02-16 LAB — LIPID PANEL
Cholesterol: 231 mg/dL — ABNORMAL HIGH (ref 0–200)
VLDL: 48 mg/dL — ABNORMAL HIGH (ref 0.0–40.0)

## 2013-02-16 LAB — HEMOGLOBIN A1C: Hgb A1c MFr Bld: 6.3 % (ref 4.6–6.5)

## 2013-02-22 ENCOUNTER — Ambulatory Visit: Payer: BC Managed Care – PPO | Admitting: Family Medicine

## 2013-03-01 ENCOUNTER — Encounter: Payer: Self-pay | Admitting: Family Medicine

## 2013-03-01 ENCOUNTER — Ambulatory Visit (INDEPENDENT_AMBULATORY_CARE_PROVIDER_SITE_OTHER): Payer: BC Managed Care – PPO | Admitting: Family Medicine

## 2013-03-01 VITALS — BP 128/90 | HR 101 | Temp 98.8°F | Ht 58.5 in | Wt 164.5 lb

## 2013-03-01 DIAGNOSIS — I10 Essential (primary) hypertension: Secondary | ICD-10-CM

## 2013-03-01 DIAGNOSIS — R7309 Other abnormal glucose: Secondary | ICD-10-CM

## 2013-03-01 DIAGNOSIS — E669 Obesity, unspecified: Secondary | ICD-10-CM

## 2013-03-01 DIAGNOSIS — E785 Hyperlipidemia, unspecified: Secondary | ICD-10-CM

## 2013-03-01 DIAGNOSIS — R739 Hyperglycemia, unspecified: Secondary | ICD-10-CM

## 2013-03-01 NOTE — Assessment & Plan Note (Signed)
Disc goals for lipids and reasons to control them Rev labs with pt Rev low sat fat diet in detail Disc cutting out fried and fast food Re check 3 mo

## 2013-03-01 NOTE — Assessment & Plan Note (Signed)
Discussed how this problem influences overall health and the risks it imposes  Reviewed plan for weight loss with lower calorie diet (via better food choices and also portion control or program like weight watchers) and exercise building up to or more than 30 minutes 5 days per week including some aerobic activity    

## 2013-03-01 NOTE — Progress Notes (Signed)
Subjective:    Patient ID: Jo Lamb, female    DOB: 10-05-1960, 52 y.o.   MRN: [id number]  HPI Here for f/u of chronic medical problems  Is feeling about the same   She started taking a multivitamin for women- to help with her energy level Diet is not optimal   Not enough time to exercise  Gets started with better eating and then falls off the wagon  Tried to start walking at work   Knows she needs to get health under control   Wt is stable with bmi of 33  Hyperglycemia  a1c is 6.3 up from 6.0 She craves sugar and carbs and it is very difficult to control   bp is stable today  No cp or palpitations or headaches or edema  No side effects to medicines  BP Readings from Last 3 Encounters:  03/01/13 128/90  11/24/12 130/85  08/18/12 126/84      Hyperlipidemia Lab Results  Component Value Date   CHOL 231* 02/16/2013   CHOL 232* 11/16/2012   CHOL 275* 08/18/2012   Lab Results  Component Value Date   HDL 35.60* 02/16/2013   HDL 36.60* 11/16/2012   HDL 39.30 08/18/2012   Lab Results  Component Value Date   LDLCALC 79 11/29/2009   LDLCALC 93 05/08/2009   LDLCALC 87 02/01/2009   Lab Results  Component Value Date   TRIG 240.0* 02/16/2013   TRIG 237.0* 11/16/2012   TRIG 244.0* 08/18/2012   Lab Results  Component Value Date   CHOLHDL 6 02/16/2013   CHOLHDL 6 11/16/2012   CHOLHDL 7 08/18/2012   Lab Results  Component Value Date   LDLDIRECT 145.5 02/16/2013   LDLDIRECT 142.9 11/16/2012   LDLDIRECT 193.0 08/18/2012   improved from the past- but LDL is still not at goal and trig up Chol / hdl ratio is high  Patient Active Problem List   Diagnosis Date Noted  . Hyperglycemia 11/14/2012  . Hand dermatitis 03/31/2011  . BACK PAIN WITH RADICULOPATHY 11/29/2008  . ESSENTIAL HYPERTENSION 11/01/2008  . OBESITY 08/11/2008  . GERD 08/11/2008  . HYPERLIPIDEMIA 07/07/2008  . NECK PAIN, CHRONIC 07/21/2007  . MIGRAINE HEADACHE 07/20/2007  . IBS 07/20/2007   Past  Medical History  Diagnosis Date  . Colon polyps     colonoscopy- polyps, hemorrhoids, divertic  . History of migraine headaches   . Back pain   . Neck pain   . Hypertension     White coat  . IBS (irritable bowel syndrome)   . Fibroids   . Carpal tunnel syndrome on both sides 02/2004// 10/22/2004    Nerve conduction study x's 2   Past Surgical History  Procedure Laterality Date  . Vaginal delivery      x's two  . Tubal ligation    . Mri      MRI of head// Negative   History  Substance Use Topics  . Smoking status: Never Smoker   . Smokeless tobacco: Not on file  . Alcohol Use: No   Family History  Problem Relation Age of Onset  . Hypertension Father    No Known Allergies Current Outpatient Prescriptions on File Prior to Visit  Medication Sig Dispense Refill  . gabapentin (NEURONTIN) 300 MG capsule Take 2 capsules by mouth three times daily  180 capsule  11  . imipramine (TOFRANIL) 25 MG tablet Take 2 pills by mouth at bedtime  60 tablet  11  . lisinopril-hydrochlorothiazide (PRINZIDE,ZESTORETIC) 10-12.5 MG per tablet  Take 1 tablet by mouth every morning.  30 tablet  11   No current facility-administered medications on file prior to visit.    Review of Systems Review of Systems  Constitutional: Negative for fever, appetite change,  and unexpected weight change. pos for intermittent fatigue from her schedule  Eyes: Negative for pain and visual disturbance.  Respiratory: Negative for cough and shortness of breath.   Cardiovascular: Negative for cp or palpitations    Gastrointestinal: Negative for nausea, diarrhea and constipation.  Genitourinary: Negative for urgency and frequency.  Skin: Negative for pallor or rash   Neurological: Negative for weakness, light-headedness, numbness and headaches.  Hematological: Negative for adenopathy. Does not bruise/bleed easily.  Psychiatric/Behavioral: Negative for dysphoric mood. The patient is not nervous/anxious.          Objective:   Physical Exam  Constitutional: She appears well-developed and well-nourished. No distress.  obese and well appearing   HENT:  Head: Normocephalic and atraumatic.  Mouth/Throat: Oropharynx is clear and moist.  Eyes: Conjunctivae and EOM are normal. Pupils are equal, round, and reactive to light. Right eye exhibits no discharge. Left eye exhibits no discharge. No scleral icterus.  Neck: Normal range of motion. Neck supple. No JVD present. Carotid bruit is not present. No thyromegaly present.  Cardiovascular: Normal rate, regular rhythm, normal heart sounds and intact distal pulses.  Exam reveals no gallop.   Pulmonary/Chest: Effort normal and breath sounds normal. No respiratory distress. She has no wheezes.  Abdominal: Soft. Bowel sounds are normal. She exhibits no distension, no abdominal bruit and no mass. There is no tenderness.  Musculoskeletal: She exhibits no edema and no tenderness.  Lymphadenopathy:    She has no cervical adenopathy.  Neurological: She is alert. She has normal reflexes. No cranial nerve deficit. She exhibits normal muscle tone. Coordination normal.  Skin: Skin is warm and dry. No rash noted. No erythema. No pallor.  Psychiatric: She has a normal mood and affect.          Assessment & Plan:

## 2013-03-01 NOTE — Patient Instructions (Addendum)
Work on low sugar and low fat diet  Try to exercise 5 days per week for 30 minutes  If you fall off the wagon- jump back on  Think about programs that would work for you  Follow up in 3 months with labs prior

## 2013-03-01 NOTE — Assessment & Plan Note (Signed)
bp in fair control at this time  No changes needed  Disc lifstyle change with low sodium diet and exercise  Lab reviewed  

## 2013-03-01 NOTE — Assessment & Plan Note (Signed)
a1c is up to 6.3- will work on low glycemic diet- did review in detail  Disc plan for diet and exercise Lab and f/u 3 mo

## 2013-05-25 ENCOUNTER — Other Ambulatory Visit (INDEPENDENT_AMBULATORY_CARE_PROVIDER_SITE_OTHER): Payer: BC Managed Care – PPO

## 2013-05-25 DIAGNOSIS — E785 Hyperlipidemia, unspecified: Secondary | ICD-10-CM

## 2013-05-25 DIAGNOSIS — R739 Hyperglycemia, unspecified: Secondary | ICD-10-CM

## 2013-05-25 DIAGNOSIS — I10 Essential (primary) hypertension: Secondary | ICD-10-CM

## 2013-05-25 DIAGNOSIS — R7309 Other abnormal glucose: Secondary | ICD-10-CM

## 2013-05-25 LAB — HEMOGLOBIN A1C: Hgb A1c MFr Bld: 6 % (ref 4.6–6.5)

## 2013-05-26 LAB — COMPREHENSIVE METABOLIC PANEL
Albumin: 4.4 g/dL (ref 3.5–5.2)
BUN: 13 mg/dL (ref 6–23)
Calcium: 9.4 mg/dL (ref 8.4–10.5)
Chloride: 103 mEq/L (ref 96–112)
Creatinine, Ser: 1 mg/dL (ref 0.4–1.2)
Glucose, Bld: 95 mg/dL (ref 70–99)
Potassium: 4.4 mEq/L (ref 3.5–5.1)

## 2013-05-26 LAB — LIPID PANEL
Cholesterol: 178 mg/dL (ref 0–200)
Triglycerides: 124 mg/dL (ref 0.0–149.0)

## 2013-06-01 ENCOUNTER — Ambulatory Visit: Payer: BC Managed Care – PPO | Admitting: Family Medicine

## 2013-06-03 ENCOUNTER — Encounter: Payer: Self-pay | Admitting: Family Medicine

## 2013-06-03 ENCOUNTER — Ambulatory Visit (INDEPENDENT_AMBULATORY_CARE_PROVIDER_SITE_OTHER): Payer: BC Managed Care – PPO | Admitting: Family Medicine

## 2013-06-03 VITALS — BP 134/82 | HR 100 | Temp 98.7°F | Ht 58.5 in | Wt 154.8 lb

## 2013-06-03 DIAGNOSIS — R739 Hyperglycemia, unspecified: Secondary | ICD-10-CM

## 2013-06-03 DIAGNOSIS — Z23 Encounter for immunization: Secondary | ICD-10-CM

## 2013-06-03 DIAGNOSIS — R7309 Other abnormal glucose: Secondary | ICD-10-CM

## 2013-06-03 DIAGNOSIS — E785 Hyperlipidemia, unspecified: Secondary | ICD-10-CM

## 2013-06-03 DIAGNOSIS — I10 Essential (primary) hypertension: Secondary | ICD-10-CM

## 2013-06-03 NOTE — Progress Notes (Signed)
Subjective:    Patient ID: Jo Lamb, female    DOB: 01-17-1961, 52 y.o.   MRN: [id number]  HPI Here for f/u of chronic medical problems  Wt is down 10 lb with bmi of 31  Is really working on it  Started the "shred" plan  sporatic exercise - she has a treadmill and the Wii fitness program   A1C came down from 6.3 to 6.0- is not eating much sugar or carbs   Cholesterol improved Lab Results  Component Value Date   CHOL 178 05/25/2013   CHOL 231* 02/16/2013   CHOL 232* 11/16/2012   Lab Results  Component Value Date   HDL 38.40* 05/25/2013   HDL 35.60* 02/16/2013   HDL 36.60* 11/16/2012   Lab Results  Component Value Date   LDLCALC 115* 05/25/2013   LDLCALC 79 11/29/2009   LDLCALC 93 05/08/2009   Lab Results  Component Value Date   TRIG 124.0 05/25/2013   TRIG 240.0* 02/16/2013   TRIG 237.0* 11/16/2012   Lab Results  Component Value Date   CHOLHDL 5 05/25/2013   CHOLHDL 6 02/16/2013   CHOLHDL 6 11/16/2012   Lab Results  Component Value Date   LDLDIRECT 145.5 02/16/2013   LDLDIRECT 142.9 11/16/2012   LDLDIRECT 193.0 08/18/2012   a big difference with her new diet plan  No longer eating fast food  Also gave up sweets for the most part   Thinks she can keep up with the plan   bp is stable today  No cp or palpitations or headaches or edema  No side effects to medicines  BP Readings from Last 3 Encounters:  06/03/13 134/82  03/01/13 128/90  11/24/12 130/85    She was prev forgetting her bp med - is off of it now - at home very good one teens/ 70s  Will stop it    Patient Active Problem List   Diagnosis Date Noted  . Hyperglycemia 11/14/2012  . Hand dermatitis 03/31/2011  . BACK PAIN WITH RADICULOPATHY 11/29/2008  . ESSENTIAL HYPERTENSION 11/01/2008  . OBESITY 08/11/2008  . GERD 08/11/2008  . HYPERLIPIDEMIA 07/07/2008  . NECK PAIN, CHRONIC 07/21/2007  . MIGRAINE HEADACHE 07/20/2007  . IBS 07/20/2007   Past Medical History  Diagnosis Date  . Colon polyps      colonoscopy- polyps, hemorrhoids, divertic  . History of migraine headaches   . Back pain   . Neck pain   . Hypertension     White coat  . IBS (irritable bowel syndrome)   . Fibroids   . Carpal tunnel syndrome on both sides 02/2004// 10/22/2004    Nerve conduction study x's 2   Past Surgical History  Procedure Laterality Date  . Vaginal delivery      x's two  . Tubal ligation    . Mri      MRI of head// Negative   History  Substance Use Topics  . Smoking status: Never Smoker   . Smokeless tobacco: Not on file  . Alcohol Use: No   Family History  Problem Relation Age of Onset  . Hypertension Father    No Known Allergies Current Outpatient Prescriptions on File Prior to Visit  Medication Sig Dispense Refill  . gabapentin (NEURONTIN) 300 MG capsule Take 2 capsules by mouth three times daily  180 capsule  11  . imipramine (TOFRANIL) 25 MG tablet Take 2 pills by mouth at bedtime  60 tablet  11  . Multiple Vitamins-Minerals (MULTIVITAMIN GUMMIES ADULT PO)  Take 2 each by mouth daily.      Marland Kitchen lisinopril-hydrochlorothiazide (PRINZIDE,ZESTORETIC) 10-12.5 MG per tablet Take 1 tablet by mouth every morning.  30 tablet  11   No current facility-administered medications on file prior to visit.    Review of Systems Review of Systems  Constitutional: Negative for fever, appetite change, fatigue and unexpected weight change.  Eyes: Negative for pain and visual disturbance.  Respiratory: Negative for cough and shortness of breath.   Cardiovascular: Negative for cp or palpitations    Gastrointestinal: Negative for nausea, diarrhea and constipation.  Genitourinary: Negative for urgency and frequency.  Skin: Negative for pallor or rash   Neurological: Negative for weakness, light-headedness, numbness and headaches.  Hematological: Negative for adenopathy. Does not bruise/bleed easily.  Psychiatric/Behavioral: Negative for dysphoric mood. The patient is not nervous/anxious.          Objective:   Physical Exam  Constitutional: She appears well-developed and well-nourished. No distress.  overwt and well app  HENT:  Head: Normocephalic and atraumatic.  Mouth/Throat: Oropharynx is clear and moist.  Eyes: Conjunctivae and EOM are normal. Pupils are equal, round, and reactive to light. Right eye exhibits no discharge. Left eye exhibits no discharge. No scleral icterus.  Neck: Normal range of motion. Neck supple. No JVD present. No thyromegaly present.  Cardiovascular: Normal rate, regular rhythm, normal heart sounds and intact distal pulses.  Exam reveals no gallop.   Pulmonary/Chest: Effort normal and breath sounds normal. No respiratory distress. She has no wheezes. She has no rales.  Musculoskeletal: She exhibits no edema.  Lymphadenopathy:    She has no cervical adenopathy.  Neurological: She is alert. She has normal reflexes. No cranial nerve deficit. She exhibits normal muscle tone. Coordination normal.  Skin: Skin is warm and dry. No rash noted. No pallor.  Psychiatric: She has a normal mood and affect.          Assessment & Plan:

## 2013-06-03 NOTE — Patient Instructions (Addendum)
Keep up the great work with diet and exercise and weight loss Sugar and cholesterol control is much better Keep it up  Stay off your bp medicine   Follow up in 6 months for annual exam with labs prior

## 2013-06-05 NOTE — Assessment & Plan Note (Signed)
Lipids improved with better diet and exercise Disc goals for lipids and reasons to control them Rev labs with pt Rev low sat fat diet in detail

## 2013-06-05 NOTE — Assessment & Plan Note (Signed)
Improvement in A1C with diet/exercise and wt loss  Commended on this  Will keep working on it

## 2013-06-05 NOTE — Assessment & Plan Note (Signed)
Pt was able to stop lisinopril hct due to dec in bp with diet /exercise and wt loss Commended on this  Will continue to follow

## 2013-08-28 ENCOUNTER — Other Ambulatory Visit: Payer: Self-pay | Admitting: Family Medicine

## 2013-08-29 NOTE — Telephone Encounter (Signed)
Electronic refill request, please advise  

## 2013-08-29 NOTE — Telephone Encounter (Signed)
done

## 2013-08-29 NOTE — Telephone Encounter (Signed)
Please refill both for 6 months thanks

## 2013-09-28 ENCOUNTER — Encounter: Payer: Self-pay | Admitting: Internal Medicine

## 2013-09-28 ENCOUNTER — Ambulatory Visit (INDEPENDENT_AMBULATORY_CARE_PROVIDER_SITE_OTHER): Payer: BC Managed Care – PPO | Admitting: Internal Medicine

## 2013-09-28 VITALS — BP 150/90 | HR 111 | Temp 98.6°F | Wt 151.0 lb

## 2013-09-28 DIAGNOSIS — J019 Acute sinusitis, unspecified: Secondary | ICD-10-CM

## 2013-09-28 MED ORDER — AMOXICILLIN 500 MG PO TABS: 1000.0000 mg | ORAL_TABLET | Freq: Two times a day (BID) | ORAL | Status: DC

## 2013-09-28 NOTE — Progress Notes (Signed)
Pre-visit discussion using our clinic review tool. No additional management support is needed unless otherwise documented below in the visit note.  

## 2013-09-28 NOTE — Assessment & Plan Note (Signed)
Sounds viral still Discussed supportive care If worsens, will start the amoxil

## 2013-09-28 NOTE — Patient Instructions (Signed)

## 2013-09-28 NOTE — Progress Notes (Signed)
   Subjective:    Patient ID: Jo Lamb, female    DOB: Aug 23, 1961, 52 y.o.   MRN: [id number]  HPI Started with burning in nose and watering eyes 4-5 days ago Now with headache Lots of sneezing Sore throat---thinks it is from drainage Congestion mostly on the left side Not much cough--some this Am. Dry  No fever No sweats or chills. Cold yesterday No SOB--some trouble breathing through nose  Taken OTC sinus med-- may have helped some  Current Outpatient Prescriptions on File Prior to Visit  Medication Sig Dispense Refill  . gabapentin (NEURONTIN) 300 MG capsule take 2 capsules by mouth three times a day  180 capsule  5  . imipramine (TOFRANIL) 25 MG tablet take 2 tablets by mouth at bedtime  60 tablet  5  . Multiple Vitamins-Minerals (MULTIVITAMIN GUMMIES ADULT PO) Take 2 each by mouth daily.       No current facility-administered medications on file prior to visit.    No Known Allergies  Past Medical History  Diagnosis Date  . Colon polyps     colonoscopy- polyps, hemorrhoids, divertic  . History of migraine headaches   . Back pain   . Neck pain   . Hypertension     White coat  . IBS (irritable bowel syndrome)   . Fibroids   . Carpal tunnel syndrome on both sides 02/2004// 10/22/2004    Nerve conduction study x's 2    Past Surgical History  Procedure Laterality Date  . Vaginal delivery      x's two  . Tubal ligation    . Mri      MRI of head// Negative    Family History  Problem Relation Age of Onset  . Hypertension Father     History   Social History  . Marital Status: Single    Spouse Name: N/A    Number of Children: N/A  . Years of Education: N/A   Occupational History  . Not on file.   Social History Main Topics  . Smoking status: Never Smoker   . Smokeless tobacco: Not on file  . Alcohol Use: No  . Drug Use: No  . Sexual Activity:    Other Topics Concern  . Not on file   Social History Narrative   Occasionally caffeine- nor  more than 1 serv/ day   Review of Systems No rash No vomiting or diarrhea Appetite is okay     Objective:   Physical Exam  Constitutional: She appears well-developed and well-nourished. No distress.  HENT:  Mouth/Throat: Oropharynx is clear and moist. No oropharyngeal exudate.  TMs normal Moderate nasal inflammation No sinus tenderness  Neck: Normal range of motion. Neck supple. No thyromegaly present.  Pulmonary/Chest: Effort normal and breath sounds normal. No respiratory distress. She has no wheezes. She has no rales.  Lymphadenopathy:    She has no cervical adenopathy.  Skin: No rash noted.          Assessment & Plan:

## 2013-12-06 ENCOUNTER — Other Ambulatory Visit: Payer: BC Managed Care – PPO

## 2013-12-13 ENCOUNTER — Encounter: Payer: BC Managed Care – PPO | Admitting: Family Medicine

## 2014-02-28 ENCOUNTER — Other Ambulatory Visit: Payer: Self-pay | Admitting: Family Medicine

## 2014-02-28 NOTE — Telephone Encounter (Signed)
Please refill for 6 mo 

## 2014-02-28 NOTE — Telephone Encounter (Signed)
Electronic refill request, please advise  

## 2014-03-01 NOTE — Telephone Encounter (Signed)
done

## 2014-05-01 ENCOUNTER — Telehealth: Payer: Self-pay | Admitting: Family Medicine

## 2014-05-01 DIAGNOSIS — R739 Hyperglycemia, unspecified: Secondary | ICD-10-CM

## 2014-05-01 DIAGNOSIS — Z0000 Encounter for general adult medical examination without abnormal findings: Secondary | ICD-10-CM | POA: Insufficient documentation

## 2014-05-01 NOTE — Telephone Encounter (Signed)
Message copied by Abner Greenspan on Mon May 01, 2014  8:01 AM ------      Message from: Ellamae Sia      Created: Tue Apr 25, 2014 12:54 PM      Regarding: Lab orders for Tuesday, 8.4.15       Patient is scheduled for CPX labs, please order future labs, Thanks , Terri       ------

## 2014-05-02 ENCOUNTER — Other Ambulatory Visit (INDEPENDENT_AMBULATORY_CARE_PROVIDER_SITE_OTHER): Payer: BC Managed Care – PPO

## 2014-05-02 DIAGNOSIS — E785 Hyperlipidemia, unspecified: Secondary | ICD-10-CM

## 2014-05-02 DIAGNOSIS — R739 Hyperglycemia, unspecified: Secondary | ICD-10-CM

## 2014-05-02 DIAGNOSIS — Z0000 Encounter for general adult medical examination without abnormal findings: Secondary | ICD-10-CM

## 2014-05-02 DIAGNOSIS — I10 Essential (primary) hypertension: Secondary | ICD-10-CM

## 2014-05-02 DIAGNOSIS — R7309 Other abnormal glucose: Secondary | ICD-10-CM

## 2014-05-02 LAB — CBC WITH DIFFERENTIAL/PLATELET
Basophils Absolute: 0 10*3/uL (ref 0.0–0.1)
Basophils Relative: 0.4 % (ref 0.0–3.0)
EOS ABS: 0 10*3/uL (ref 0.0–0.7)
Eosinophils Relative: 0.8 % (ref 0.0–5.0)
HCT: 41 % (ref 36.0–46.0)
Hemoglobin: 13.6 g/dL (ref 12.0–15.0)
LYMPHS PCT: 43.9 % (ref 12.0–46.0)
Lymphs Abs: 2.6 10*3/uL (ref 0.7–4.0)
MCHC: 33.3 g/dL (ref 30.0–36.0)
MCV: 91.5 fl (ref 78.0–100.0)
Monocytes Absolute: 0.5 10*3/uL (ref 0.1–1.0)
Monocytes Relative: 7.7 % (ref 3.0–12.0)
NEUTROS PCT: 47.2 % (ref 43.0–77.0)
Neutro Abs: 2.8 10*3/uL (ref 1.4–7.7)
PLATELETS: 211 10*3/uL (ref 150.0–400.0)
RBC: 4.48 Mil/uL (ref 3.87–5.11)
RDW: 12.7 % (ref 11.5–15.5)
WBC: 6 10*3/uL (ref 4.0–10.5)

## 2014-05-02 LAB — COMPREHENSIVE METABOLIC PANEL
ALT: 31 U/L (ref 0–35)
AST: 28 U/L (ref 0–37)
Albumin: 4.4 g/dL (ref 3.5–5.2)
Alkaline Phosphatase: 77 U/L (ref 39–117)
BUN: 12 mg/dL (ref 6–23)
CALCIUM: 9.5 mg/dL (ref 8.4–10.5)
CHLORIDE: 102 meq/L (ref 96–112)
CO2: 32 mEq/L (ref 19–32)
Creatinine, Ser: 1 mg/dL (ref 0.4–1.2)
GFR: 65.18 mL/min (ref >60.00)
Glucose, Bld: 101 mg/dL — ABNORMAL HIGH (ref 70–99)
POTASSIUM: 4.2 meq/L (ref 3.5–5.1)
SODIUM: 140 meq/L (ref 135–145)
Total Bilirubin: 0.7 mg/dL (ref 0.2–1.2)
Total Protein: 7.2 g/dL (ref 6.0–8.3)

## 2014-05-02 LAB — LIPID PANEL
CHOL/HDL RATIO: 5
Cholesterol: 190 mg/dL (ref 0–200)
HDL: 36.1 mg/dL — AB (ref >39.00)
LDL CALC: 120 mg/dL — AB (ref 0–99)
NonHDL: 153.9
Triglycerides: 170 mg/dL — ABNORMAL HIGH (ref 0.0–149.0)
VLDL: 34 mg/dL (ref 0.0–40.0)

## 2014-05-02 LAB — TSH: TSH: 2.29 u[IU]/mL (ref 0.35–4.50)

## 2014-05-02 LAB — HEMOGLOBIN A1C: Hgb A1c MFr Bld: 6 % (ref 4.6–6.5)

## 2014-05-09 ENCOUNTER — Encounter: Payer: Self-pay | Admitting: Family Medicine

## 2014-05-09 ENCOUNTER — Ambulatory Visit (INDEPENDENT_AMBULATORY_CARE_PROVIDER_SITE_OTHER): Payer: BC Managed Care – PPO | Admitting: Family Medicine

## 2014-05-09 VITALS — BP 140/85 | HR 88 | Temp 97.9°F | Ht <= 58 in | Wt 153.5 lb

## 2014-05-09 DIAGNOSIS — E669 Obesity, unspecified: Secondary | ICD-10-CM

## 2014-05-09 DIAGNOSIS — R739 Hyperglycemia, unspecified: Secondary | ICD-10-CM

## 2014-05-09 DIAGNOSIS — R7309 Other abnormal glucose: Secondary | ICD-10-CM

## 2014-05-09 DIAGNOSIS — Z0000 Encounter for general adult medical examination without abnormal findings: Secondary | ICD-10-CM

## 2014-05-09 DIAGNOSIS — I10 Essential (primary) hypertension: Secondary | ICD-10-CM

## 2014-05-09 DIAGNOSIS — E785 Hyperlipidemia, unspecified: Secondary | ICD-10-CM

## 2014-05-09 DIAGNOSIS — Z23 Encounter for immunization: Secondary | ICD-10-CM

## 2014-05-09 MED ORDER — GABAPENTIN 300 MG PO CAPS: ORAL_CAPSULE | ORAL | Status: DC

## 2014-05-09 MED ORDER — IMIPRAMINE HCL 25 MG PO TABS: ORAL_TABLET | ORAL | Status: DC

## 2014-05-09 NOTE — Assessment & Plan Note (Signed)
Lab Results  Component Value Date   HGBA1C 6.0 05/02/2014    This is stable Enc further wt loss to prev DM Also exercise 5 d per week

## 2014-05-09 NOTE — Patient Instructions (Signed)
Keep checking blood pressure at home Keep working on weight loss  Tetanus (Tdap) vaccine today  Don't forget to see your gyn for pap test  Let us know if you change your mind about a mammogram  To raise good cholesterol (HDL)- exercise (work up to 5 days per week) eat fish or take fish oil (omega 3)

## 2014-05-09 NOTE — Progress Notes (Signed)
Pre visit review using our clinic review tool, if applicable. No additional management support is needed unless otherwise documented below in the visit note. 

## 2014-05-09 NOTE — Progress Notes (Signed)
Subjective:    Patient ID: Jo Lamb, female    DOB: [birth year], 53 y.o.   MRN: [id number]  HPI Here for health maintenance exam and to review chronic medical problems    Doing pretty well   Wt is up 2 lb with bmi of 32 Gained and then lost since her last visit (10 lb) Now is using an [employer] program - healthy eating and habits (there are some protein shakes) Some exercise - "not enough"  Uses treadmill and plays badmitton   Td- ? Last one - ? Over 10 years ago  Will get that today   Declines mammograms Does want an exam   Has had colonosc several years ago   Flu vaccine 9/14  Pap 3/05 She has not had a pap since then - she will see her gyn for that  Done with menses   Mammogram-declined in the past Self exam  White coat HTN Outside of the office - she does check it - usually runs 120s/ 80s  No problems  BP Readings from Last 3 Encounters:  05/09/14 142/92  09/28/13 150/90  06/03/13 134/82    No medicines   Hyperglycemia Lab Results  Component Value Date   HGBA1C 6.0 05/02/2014   this is stable from last check  She does not eat a lot of sugar (has in the past)- the current program she is on eliminates a lot of that   Mood -pretty stable/good   Hyperlipidemia  Lab Results  Component Value Date   CHOL 190 05/02/2014   CHOL 178 05/25/2013   CHOL 231* 02/16/2013   Lab Results  Component Value Date   HDL 36.10* 05/02/2014   HDL 38.40* 05/25/2013   HDL 35.60* 02/16/2013   Lab Results  Component Value Date   LDLCALC 120* 05/02/2014   LDLCALC 115* 05/25/2013   LDLCALC 79 11/29/2009   Lab Results  Component Value Date   TRIG 170.0* 05/02/2014   TRIG 124.0 05/25/2013   TRIG 240.0* 02/16/2013   Lab Results  Component Value Date   CHOLHDL 5 05/02/2014   CHOLHDL 5 05/25/2013   CHOLHDL 6 02/16/2013   Lab Results  Component Value Date   LDLDIRECT 145.5 02/16/2013   LDLDIRECT 142.9 11/16/2012   LDLDIRECT 193.0 08/18/2012   from 2013 - a big improvement  overall   Patient Active Problem List   Diagnosis Date Noted  . Routine general medical examination at a health care facility 05/01/2014  . Hyperglycemia 11/14/2012  . Hand dermatitis 03/31/2011  . BACK PAIN WITH RADICULOPATHY 11/29/2008  . ESSENTIAL HYPERTENSION 11/01/2008  . OBESITY 08/11/2008  . GERD 08/11/2008  . HYPERLIPIDEMIA 07/07/2008  . NECK PAIN, CHRONIC 07/21/2007  . MIGRAINE HEADACHE 07/20/2007  . IBS 07/20/2007   Past Medical History  Diagnosis Date  . Colon polyps     colonoscopy- polyps, hemorrhoids, divertic  . History of migraine headaches   . Back pain   . Neck pain   . Hypertension     White coat  . IBS (irritable bowel syndrome)   . Fibroids   . Carpal tunnel syndrome on both sides 02/2004// 10/22/2004    Nerve conduction study x's 2   Past Surgical History  Procedure Laterality Date  . Vaginal delivery      x's two  . Tubal ligation    . Mri      MRI of head// Negative   History  Substance Use Topics  . Smoking status: Never Smoker   .  Smokeless tobacco: Not on file  . Alcohol Use: No   Family History  Problem Relation Age of Onset  . Hypertension Father    No Known Allergies No current outpatient prescriptions on file prior to visit.   No current facility-administered medications on file prior to visit.    Review of Systems Review of Systems  Constitutional: Negative for fever, appetite change, fatigue and unexpected weight change.  Eyes: Negative for pain and visual disturbance.  Respiratory: Negative for cough and shortness of breath.   Cardiovascular: Negative for cp or palpitations    Gastrointestinal: Negative for nausea, diarrhea and constipation.  Genitourinary: Negative for urgency and frequency.  Skin: Negative for pallor or rash   Neurological: Negative for weakness, light-headedness, numbness and headaches.  Hematological: Negative for adenopathy. Does not bruise/bleed easily.  Psychiatric/Behavioral: Negative for  dysphoric mood. The patient is not nervous/anxious.         Objective:   Physical Exam  Constitutional: She appears well-developed and well-nourished. No distress.  obese and well appearing   HENT:  Head: Normocephalic and atraumatic.  Right Ear: External ear normal.  Left Ear: External ear normal.  Mouth/Throat: Oropharynx is clear and moist.  Eyes: Conjunctivae and EOM are normal. Pupils are equal, round, and reactive to light. No scleral icterus.  Neck: Normal range of motion. Neck supple. No JVD present. Carotid bruit is not present. No thyromegaly present.  Cardiovascular: Normal rate, regular rhythm, normal heart sounds and intact distal pulses.  Exam reveals no gallop.   Pulmonary/Chest: Effort normal and breath sounds normal. No respiratory distress. She has no wheezes. She exhibits no tenderness.  Abdominal: Soft. Bowel sounds are normal. She exhibits no distension, no abdominal bruit and no mass. There is no tenderness.  Genitourinary: No breast swelling, tenderness, discharge or bleeding.  Breast exam: No mass, nodules, thickening, tenderness, bulging, retraction, inflamation, nipple discharge or skin changes noted.  No axillary or clavicular LA.      Musculoskeletal: Normal range of motion. She exhibits no edema and no tenderness.  Lymphadenopathy:    She has no cervical adenopathy.  Neurological: She is alert. She has normal reflexes. No cranial nerve deficit. She exhibits normal muscle tone. Coordination normal.  Skin: Skin is warm and dry. No rash noted. No erythema. No pallor.  Solar lentigos diffusely   Psychiatric: She has a normal mood and affect.          Assessment & Plan:   Problem List Items Addressed This Visit     Cardiovascular and Mediastinum   ESSENTIAL HYPERTENSION - Primary      bp in fair control at this time  BP Readings from Last 1 Encounters:  05/09/14 142/92  re check 140/85 Better at home -whitecoat  No changes needed Disc lifstyle  change with low sodium diet and exercise        Other   HYPERLIPIDEMIA     Disc goals for lipids and reasons to control them Rev labs with pt Rev low sat fat diet in detail  Improvement in the past several years with better diet  To inc HDL - disc exercise and fish oil    OBESITY     Discussed how this problem influences overall health and the risks it imposes  Reviewed plan for weight loss with lower calorie diet (via better food choices and also portion control or program like weight watchers) and exercise building up to or more than 30 minutes 5 days per week including some aerobic  activity    Will try to inc exercise     Hyperglycemia      Lab Results  Component Value Date   HGBA1C 6.0 05/02/2014    This is stable Enc further wt loss to prev DM Also exercise 5 d per week    Routine general medical examination at a health care facility     Reviewed health habits including diet and exercise and skin cancer prevention Reviewed appropriate screening tests for age  Also reviewed health mt list, fam hx and immunization status , as well as social and family history   Declines mammograms- but we did a breast exam today Will see her gyn for pap Labs rev Tdap today     Other Visit Diagnoses   Need for Tdap vaccination        Relevant Orders       Tdap vaccine greater than or equal to 7yo IM (Completed)

## 2014-05-09 NOTE — Assessment & Plan Note (Signed)
Reviewed health habits including diet and exercise and skin cancer prevention Reviewed appropriate screening tests for age  Also reviewed health mt list, fam hx and immunization status , as well as social and family history   Declines mammograms- but we did a breast exam today Will see her gyn for pap Labs rev Tdap today

## 2014-05-09 NOTE — Assessment & Plan Note (Signed)
bp in fair control at this time  BP Readings from Last 1 Encounters:  05/09/14 142/92  re check 140/85 Better at home -whitecoat  No changes needed Disc lifstyle change with low sodium diet and exercise

## 2014-05-09 NOTE — Assessment & Plan Note (Signed)
Discussed how this problem influences overall health and the risks it imposes  Reviewed plan for weight loss with lower calorie diet (via better food choices and also portion control or program like weight watchers) and exercise building up to or more than 30 minutes 5 days per week including some aerobic activity    Will try to inc exercise

## 2014-05-09 NOTE — Assessment & Plan Note (Signed)
Disc goals for lipids and reasons to control them Rev labs with pt Rev low sat fat diet in detail  Improvement in the past several years with better diet  To inc HDL - disc exercise and fish oil

## 2014-12-14 ENCOUNTER — Emergency Department (HOSPITAL_COMMUNITY): Payer: 59

## 2014-12-14 ENCOUNTER — Emergency Department (HOSPITAL_COMMUNITY)
Admission: EM | Admit: 2014-12-14 | Discharge: 2014-12-14 | Disposition: A | Discharge status: Home / Self Care | Payer: 59 | Attending: Emergency Medicine | Admitting: Emergency Medicine

## 2014-12-14 ENCOUNTER — Encounter (HOSPITAL_COMMUNITY): Payer: Self-pay | Admitting: Emergency Medicine

## 2014-12-14 DIAGNOSIS — Z8541 Personal history of malignant neoplasm of cervix uteri: Secondary | ICD-10-CM | POA: Diagnosis not present

## 2014-12-14 DIAGNOSIS — G43909 Migraine, unspecified, not intractable, without status migrainosus: Secondary | ICD-10-CM | POA: Insufficient documentation

## 2014-12-14 DIAGNOSIS — R079 Chest pain, unspecified: Secondary | ICD-10-CM | POA: Diagnosis not present

## 2014-12-14 DIAGNOSIS — K589 Irritable bowel syndrome without diarrhea: Secondary | ICD-10-CM | POA: Insufficient documentation

## 2014-12-14 DIAGNOSIS — Z79899 Other long term (current) drug therapy: Secondary | ICD-10-CM | POA: Diagnosis not present

## 2014-12-14 DIAGNOSIS — Z86010 Personal history of colonic polyps: Secondary | ICD-10-CM | POA: Diagnosis not present

## 2014-12-14 DIAGNOSIS — I10 Essential (primary) hypertension: Secondary | ICD-10-CM | POA: Diagnosis not present

## 2014-12-14 LAB — BASIC METABOLIC PANEL
Anion gap: 12 (ref 5–15)
BUN: 16 mg/dL (ref 6–23)
CO2: 28 mmol/L (ref 19–32)
CREATININE: 0.98 mg/dL (ref 0.50–1.10)
Calcium: 10.1 mg/dL (ref 8.4–10.5)
Chloride: 99 mmol/L (ref 96–112)
GFR calc non Af Amer: 64 mL/min — ABNORMAL LOW (ref >90)
GFR, EST AFRICAN AMERICAN: 74 mL/min — AB (ref >90)
GLUCOSE: 132 mg/dL — AB (ref 70–99)
Potassium: 3.6 mmol/L (ref 3.5–5.1)
Sodium: 139 mmol/L (ref 135–145)

## 2014-12-14 LAB — BRAIN NATRIURETIC PEPTIDE: B NATRIURETIC PEPTIDE 5: 5.7 pg/mL (ref 0.0–100.0)

## 2014-12-14 LAB — CBC
HEMATOCRIT: 43.4 % (ref 36.0–46.0)
Hemoglobin: 14.7 g/dL (ref 12.0–15.0)
MCH: 30.6 pg (ref 26.0–34.0)
MCHC: 33.9 g/dL (ref 30.0–36.0)
MCV: 90.4 fL (ref 78.0–100.0)
Platelets: 227 10*3/uL (ref 150–400)
RBC: 4.8 MIL/uL (ref 3.87–5.11)
RDW: 12.7 % (ref 11.5–15.5)
WBC: 11.1 10*3/uL — AB (ref 4.0–10.5)

## 2014-12-14 LAB — I-STAT TROPONIN, ED
Troponin i, poc: 0 ng/mL (ref 0.00–0.08)
Troponin i, poc: 0.01 ng/mL (ref 0.00–0.08)

## 2014-12-14 LAB — D-DIMER, QUANTITATIVE (NOT AT ARMC)

## 2014-12-14 MED ORDER — IBUPROFEN 600 MG PO TABS: 600.0000 mg | ORAL_TABLET | Freq: Three times a day (TID) | ORAL | Status: DC | PRN

## 2014-12-14 MED ORDER — ONDANSETRON HCL 4 MG/2ML IJ SOLN
4.0000 mg | Freq: Once | INTRAMUSCULAR | Status: AC
  Administered 2014-12-14: 4 mg via INTRAVENOUS
  Filled 2014-12-14: qty 2

## 2014-12-14 MED ORDER — ONDANSETRON 8 MG PO TBDP: 8.0000 mg | ORAL_TABLET | Freq: Three times a day (TID) | ORAL | Status: DC | PRN

## 2014-12-14 MED ORDER — MORPHINE SULFATE 4 MG/ML IJ SOLN
4.0000 mg | Freq: Once | INTRAMUSCULAR | Status: AC
  Administered 2014-12-14: 4 mg via INTRAVENOUS
  Filled 2014-12-14: qty 1

## 2014-12-14 MED ORDER — GI COCKTAIL ~~LOC~~
30.0000 mL | Freq: Once | ORAL | Status: AC
  Administered 2014-12-14: 30 mL via ORAL
  Filled 2014-12-14: qty 30

## 2014-12-14 NOTE — ED Notes (Signed)
Pt reports feeling nauseous around 0545 - pt vomited while in room. MD notified.

## 2014-12-14 NOTE — ED Notes (Addendum)
C/o pain in center of chest that radiates to neck, bilateral jaws, and back since earlier in the day Wednesday.  Also reports sob.  Denies nausea and vomiting.

## 2014-12-14 NOTE — ED Provider Notes (Signed)
CSN: [id number]     Arrival date & time 12/14/14  0106 History   This chart was scribed for Jola Schmidt, MD by Chester Holstein, ED Scribe. This patient was seen in room A08C/A08C and the patient's care was started at 3:13 AM.     Chief Complaint  Patient presents with  . Chest Pain    Patient is a 54 y.o. female presenting with chest pain. The history is provided by the patient. No language interpreter was used.  Chest Pain  HPI Comments: Jo Lamb is a 54 y.o. female with PMHx of HTN, neck pain, and back pain who presents to the Emergency Department complaining of constant chest pain with acute onset at 11 AM yesterday worsening last night. Pt sates she was sitting at desk at work at onset. Pt notes pain radiates to her neck and jaw.  Pt states movement and deep inspiration aggravates the pain. She reports stabbing "pins" sensation near neck with deep inspiration. Pt has not taken any medication for BP in approximately 1 year. Pt notes associated back pain, stating it feels like someone is squeezing her. Pt is not a smoker. Pt with FHx of thrombosis. Pt denies SOB, swelling in legs, h/o DVT/PE, recent surgeries, and prolonged travel.   Past Medical History  Diagnosis Date  . Colon polyps     colonoscopy- polyps, hemorrhoids, divertic  . History of migraine headaches   . Back pain   . Neck pain   . Hypertension     White coat  . IBS (irritable bowel syndrome)   . Fibroids   . Carpal tunnel syndrome on both sides 02/2004// 10/22/2004    Nerve conduction study x's 2   Past Surgical History  Procedure Laterality Date  . Vaginal delivery      x's two  . Tubal ligation    . Mri      MRI of head// Negative   Family History  Problem Relation Age of Onset  . Hypertension Father    History  Substance Use Topics  . Smoking status: Never Smoker   . Smokeless tobacco: Not on file  . Alcohol Use: No   OB History    No data available     Review of Systems   Cardiovascular: Positive for chest pain.   A complete 10 system review of systems was obtained and all systems are negative except as noted in the HPI and PMH.     Allergies  Review of patient's allergies indicates no known allergies.  Home Medications   Prior to Admission medications   Medication Sig Start Date End Date Taking? Authorizing Provider  gabapentin (NEURONTIN) 300 MG capsule take 2 capsules by mouth three times a day Patient taking differently: Take 600 mg by mouth at bedtime. take 2 capsules by mouth three times a day 05/09/14  Yes Abner Greenspan, MD  imipramine (TOFRANIL) 25 MG tablet take 2 tablets by mouth at bedtime 05/09/14  Yes Marne A Tower, MD   BP 160/103 mmHg  Pulse 115  Temp(Src) 98.3 F (36.8 C) (Oral)  Resp 17  Ht 4\' 11"  (1.499 m)  Wt 150 lb (68.04 kg)  BMI 30.28 kg/m2  SpO2 99% Physical Exam  Constitutional: She is oriented to person, place, and time. She appears well-developed and well-nourished. No distress.  HENT:  Head: Normocephalic and atraumatic.  Eyes: EOM are normal.  Neck: Normal range of motion.  Cardiovascular: Normal rate, regular rhythm and normal heart sounds.   Pulmonary/Chest:  Effort normal and breath sounds normal.  Abdominal: Soft. She exhibits no distension. There is no tenderness.  Musculoskeletal: Normal range of motion. She exhibits no edema or tenderness.  Neurological: She is alert and oriented to person, place, and time.  Skin: Skin is warm and dry.  Psychiatric: She has a normal mood and affect. Judgment normal.  Nursing note and vitals reviewed.   ED Course  Procedures (including critical care time) DIAGNOSTIC STUDIES: Oxygen Saturation is 99% on room air, normal by my interpretation.    COORDINATION OF CARE: 3:20 AM Discussed treatment plan with patient at beside, the patient agrees with the plan and has no further questions at this time.   Labs Review Labs Reviewed  CBC - Abnormal; Notable for the following:     WBC 11.1 (*)    All other components within normal limits  BASIC METABOLIC PANEL - Abnormal; Notable for the following:    Glucose, Bld 132 (*)    GFR calc non Af Amer 64 (*)    GFR calc Af Amer 74 (*)    All other components within normal limits  BRAIN NATRIURETIC PEPTIDE  D-DIMER, QUANTITATIVE  I-STAT TROPOININ, ED  Randolm Idol, ED    Imaging Review Dg Chest 2 View  12/14/2014   CLINICAL DATA:  Acute onset of mid chest pain and anterior neck pain. Initial encounter.  EXAM: CHEST  2 VIEW  COMPARISON:  Thoracic spine radiographs performed 11/29/2008  FINDINGS: The lungs are well-aerated. Minimal left basilar atelectasis is noted. There is no evidence of pleural effusion or pneumothorax.  The heart is borderline normal in size. No acute osseous abnormalities are seen.  IMPRESSION: Minimal left basilar atelectasis noted; lungs otherwise clear.   Electronically Signed   By: Garald Balding M.D.   On: 12/14/2014 01:58    EKG Interpretation #1  Date: 12/15/2014  Rate: 120  Rhythm: sinus tachycardia  QRS Axis: normal  Intervals: normal  ST/T Wave abnormalities: normal  Conduction Disutrbances: none  Narrative Interpretation:   Old EKG Reviewed: no prior ecg available       EKG Interpretation #2   Date/Time:  Thursday December 14 2014 03:27:53 EDT Ventricular Rate:  106 PR Interval:  173 QRS Duration: 88 QT Interval:  364 QTC Calculation: 483 R Axis:   81 Text Interpretation:  Sinus tachycardia Low voltage, precordial leads Borderline T abnormalities, anterior leads No significant change was found Reconfirmed by Josiah Wojtaszek  MD, Giannina Bartolome ([id number]) on 12/15/2014 7:49:18 AM            MDM   Final diagnoses:  Chest pain, unspecified chest pain type    EKG 2 is negative.  Troponin 2 is negative.  D-dimer is normal.  Doubt ACS.  Doubt PE.  Patient is feeling somewhat better at this time.  This may represent pleurisy.  Doubt dissection.  Vital signs are normal.  Heart rate  improved.  Discharge home in good condition.  Primary care follow-up.    I personally performed the services described in this documentation, which was scribed in my presence. The recorded information has been reviewed and is accurate.     Jola Schmidt, MD 12/15/14 [phone]

## 2014-12-14 NOTE — ED Notes (Signed)
Transported to xray 

## 2014-12-14 NOTE — Discharge Instructions (Signed)
Chest Pain (Nonspecific) °It is often hard to give a specific diagnosis for the cause of chest pain. There is always a chance that your pain could be related to something serious, such as a heart attack or a blood clot in the lungs. You need to follow up with your health care provider for further evaluation. °CAUSES  °· Heartburn. °· Pneumonia or bronchitis. °· Anxiety or stress. °· Inflammation around your heart (pericarditis) or lung (pleuritis or pleurisy). °· A blood clot in the lung. °· A collapsed lung (pneumothorax). It can develop suddenly on its own (spontaneous pneumothorax) or from trauma to the chest. °· Shingles infection (herpes zoster virus). °The chest wall is composed of bones, muscles, and cartilage. Any of these can be the source of the pain. °· The bones can be bruised by injury. °· The muscles or cartilage can be strained by coughing or overwork. °· The cartilage can be affected by inflammation and become sore (costochondritis). °DIAGNOSIS  °Lab tests or other studies may be needed to find the cause of your pain. Your health care provider may have you take a test called an ambulatory electrocardiogram (ECG). An ECG records your heartbeat patterns over a 24-hour period. You may also have other tests, such as: °· Transthoracic echocardiogram (TTE). During echocardiography, sound waves are used to evaluate how blood flows through your heart. °· Transesophageal echocardiogram (TEE). °· Cardiac monitoring. This allows your health care provider to monitor your heart rate and rhythm in real time. °· Holter monitor. This is a portable device that records your heartbeat and can help diagnose heart arrhythmias. It allows your health care provider to track your heart activity for several days, if needed. °· Stress tests by exercise or by giving medicine that makes the heart beat faster. °TREATMENT  °· Treatment depends on what may be causing your chest pain. Treatment may include: °· Acid blockers for  heartburn. °· Anti-inflammatory medicine. °· Pain medicine for inflammatory conditions. °· Antibiotics if an infection is present. °· You may be advised to change lifestyle habits. This includes stopping smoking and avoiding alcohol, caffeine, and chocolate. °· You may be advised to keep your head raised (elevated) when sleeping. This reduces the chance of acid going backward from your stomach into your esophagus. °Most of the time, nonspecific chest pain will improve within 2-3 days with rest and mild pain medicine.  °HOME CARE INSTRUCTIONS  °· If antibiotics were prescribed, take them as directed. Finish them even if you start to feel better. °· For the next few days, avoid physical activities that bring on chest pain. Continue physical activities as directed. °· Do not use any tobacco products, including cigarettes, chewing tobacco, or electronic cigarettes. °· Avoid drinking alcohol. °· Only take medicine as directed by your health care provider. °· Follow your health care provider's suggestions for further testing if your chest pain does not go away. °· Keep any follow-up appointments you made. If you do not go to an appointment, you could develop lasting (chronic) problems with pain. If there is any problem keeping an appointment, call to reschedule. °SEEK MEDICAL CARE IF:  °· Your chest pain does not go away, even after treatment. °· You have a rash with blisters on your chest. °· You have a fever. °SEEK IMMEDIATE MEDICAL CARE IF:  °· You have increased chest pain or pain that spreads to your arm, neck, jaw, back, or abdomen. °· You have shortness of breath. °· You have an increasing cough, or you cough   up blood. °· You have severe back or abdominal pain. °· You feel nauseous or vomit. °· You have severe weakness. °· You faint. °· You have chills. °This is an emergency. Do not wait to see if the pain will go away. Get medical help at once. Call your local emergency services (911 in [place] Do not drive  yourself to the hospital. °MAKE SURE YOU:  °· Understand these instructions. °· Will watch your condition. °· Will get help right away if you are not doing well or get worse. °Document Released: 06/25/2005 Document Revised: 09/20/2013 Document Reviewed: 04/20/2008 °[employer]® Patient Information ©2015 [employer]. This information is not intended to replace advice given to you by your health care provider. Make sure you discuss any questions you have with your health care provider. °Pleurisy °Pleurisy is an inflammation and swelling of the lining of the lungs (pleura). Because of this inflammation, it hurts to breathe. It can be aggravated by coughing, laughing, or deep breathing. Pleurisy is often caused by an underlying infection or disease.  °HOME CARE INSTRUCTIONS  °Monitor your pleurisy for any changes. The following actions may help to alleviate any discomfort you are experiencing: °· Medicine may help with pain. Only take over-the-counter or prescription medicines for pain, discomfort, or fever as directed by your health care provider. °· Only take antibiotic medicine as directed. Make sure to finish it even if you start to feel better. °SEEK MEDICAL CARE IF:  °· Your pain is not controlled with medicine or is increasing. °· You have an increase in pus-like (purulent) secretions brought up with coughing. °SEEK IMMEDIATE MEDICAL CARE IF:  °· You have blue or dark lips, fingernails, or toenails. °· You are coughing up blood. °· You have increased difficulty breathing. °· You have continuing pain unrelieved by medicine or pain lasting more than 1 week. °· You have pain that radiates into your neck, arms, or jaw. °· You develop increased shortness of breath or wheezing. °· You develop a fever, rash, vomiting, fainting, or other serious symptoms. °MAKE SURE YOU: °· Understand these instructions.   °· Will watch your condition.   °· Will get help right away if you are not doing well or get worse. °  °Document  Released: 09/15/2005 Document Revised: 05/18/2013 Document Reviewed: 02/27/2013 °[employer]® Patient Information ©2015 [employer]. This information is not intended to replace advice given to you by your health care provider. Make sure you discuss any questions you have with your health care provider. ° °

## 2015-05-30 ENCOUNTER — Other Ambulatory Visit: Payer: Self-pay | Admitting: Family Medicine

## 2015-05-31 NOTE — Telephone Encounter (Signed)
Please schedule f/u or PE and refil until then

## 2015-05-31 NOTE — Telephone Encounter (Signed)
Left voicemail requesting pt to call office back 

## 2015-05-31 NOTE — Telephone Encounter (Signed)
Electronic refill request, pt hasn't been seen in over a year, last CPE was 05/09/14 and no future appt., please advise

## 2015-06-05 NOTE — Telephone Encounter (Signed)
Pt scheduled appointment 9/7

## 2015-06-06 ENCOUNTER — Ambulatory Visit (INDEPENDENT_AMBULATORY_CARE_PROVIDER_SITE_OTHER): Payer: 59 | Admitting: Family Medicine

## 2015-06-06 ENCOUNTER — Encounter: Payer: Self-pay | Admitting: Family Medicine

## 2015-06-06 VITALS — BP 140/100 | HR 85 | Temp 98.4°F | Ht <= 58 in | Wt 163.8 lb

## 2015-06-06 DIAGNOSIS — E669 Obesity, unspecified: Secondary | ICD-10-CM

## 2015-06-06 DIAGNOSIS — R739 Hyperglycemia, unspecified: Secondary | ICD-10-CM | POA: Diagnosis not present

## 2015-06-06 DIAGNOSIS — I10 Essential (primary) hypertension: Secondary | ICD-10-CM | POA: Diagnosis not present

## 2015-06-06 DIAGNOSIS — E785 Hyperlipidemia, unspecified: Secondary | ICD-10-CM | POA: Diagnosis not present

## 2015-06-06 MED ORDER — GABAPENTIN 300 MG PO CAPS: ORAL_CAPSULE | ORAL | Status: DC

## 2015-06-06 MED ORDER — IMIPRAMINE HCL 25 MG PO TABS: ORAL_TABLET | ORAL | Status: DC

## 2015-06-06 MED ORDER — LISINOPRIL-HYDROCHLOROTHIAZIDE 10-12.5 MG PO TABS: 1.0000 | ORAL_TABLET | ORAL | Status: DC

## 2015-06-06 NOTE — Progress Notes (Signed)
Subjective:    Patient ID: Jo Lamb, female    DOB: [birth year], 54 y.o.   MRN: [id number]  HPI Here for f/u of chronic health problems   Had a good summer overall   Has not felt great the past few days  A little light headed   Her bp monitor has not been working  163/101 and then 148/98 on her husband's monitor   BP Readings from Last 3 Encounters:  06/06/15 140/100  12/14/14 110/73  05/09/14 140/85    [place] off the wagon taking care of herself  Some yard work-no extra exercise  Plans to get back on track  Out of imipramine for 4-5 nights  Called and we were not able to reach her   Lab Results  Component Value Date   HGBA1C 6.0 05/02/2014   she thinks it will be up  Wt is up with bmi of 34   She was on the isogenics for a while -lost wt and felt good and then got off track  Junk food is the problem   Hx of hyperlipidemia Lab Results  Component Value Date   CHOL 190 05/02/2014   HDL 36.10* 05/02/2014   LDLCALC 120* 05/02/2014   LDLDIRECT 145.5 02/16/2013   TRIG 170.0* 05/02/2014   CHOLHDL 5 05/02/2014       Review of Systems Review of Systems  Constitutional: Negative for fever, appetite change, fatigue and unexpected weight change.  Eyes: Negative for pain and visual disturbance.  Respiratory: Negative for cough and shortness of breath.   Cardiovascular: Negative for cp or palpitations    Gastrointestinal: Negative for nausea, diarrhea and constipation.  Genitourinary: Negative for urgency and frequency.  Skin: Negative for pallor or rash   Neurological: Negative for weakness, light-headedness, numbness and headaches. pos for insomnia off her imipramine  Hematological: Negative for adenopathy. Does not bruise/bleed easily.  Psychiatric/Behavioral: Negative for dysphoric mood. The patient is nervous/anxious.         Objective:   Physical Exam  Constitutional: She appears well-developed and well-nourished. No distress.  obese and well  appearing   HENT:  Head: Normocephalic and atraumatic.  Mouth/Throat: Oropharynx is clear and moist.  Eyes: Conjunctivae and EOM are normal. Pupils are equal, round, and reactive to light.  Neck: Normal range of motion. Neck supple. No JVD present. Carotid bruit is not present. No thyromegaly present.  Cardiovascular: Normal rate, regular rhythm, normal heart sounds and intact distal pulses.  Exam reveals no gallop.   Pulmonary/Chest: Effort normal and breath sounds normal. No respiratory distress. She has no wheezes. She has no rales.  No crackles  Abdominal: Soft. Bowel sounds are normal. She exhibits no distension, no abdominal bruit and no mass. There is no tenderness.  Musculoskeletal: She exhibits no edema.  Lymphadenopathy:    She has no cervical adenopathy.  Neurological: She is alert. She has normal reflexes.  Skin: Skin is warm and dry. No rash noted.  Psychiatric: She has a normal mood and affect.          Assessment & Plan:   Problem List Items Addressed This Visit      Cardiovascular and Mediastinum   Essential hypertension - Primary    bp is up after getting away from healthy habits and gaining wt  Pt is motivated to get back to former program Start back on lisinopril hct in the meantime for bp control F/u planned for re check and labs  Relevant Medications   lisinopril-hydrochlorothiazide (PRINZIDE,ZESTORETIC) 10-12.5 MG per tablet     Other   Hyperglycemia    Will re check a1c at next visit -will likely be up in light of wt gain and worse habits Disc wt loss/ low glycemic diet and exercise to prevent DM      Hyperlipidemia    Disc goals for lipids and reasons to control them Rev labs with pt from last draw  Rev low sat fat diet in detail  Will re check at fu appt       Relevant Medications   lisinopril-hydrochlorothiazide (PRINZIDE,ZESTORETIC) 10-12.5 MG per tablet   Obesity    Discussed how this problem influences overall health and the  risks it imposes  Reviewed plan for weight loss with lower calorie diet (via better food choices and also portion control or program like weight watchers) and exercise building up to or more than 30 minutes 5 days per week including some aerobic activity   Pt plans to return to former plan that helped her loose wt in the past

## 2015-06-06 NOTE — Patient Instructions (Signed)
Get back on track with healthy diet and exercise  Start back on lisinopril hct  Also imipramine daily  If side effects or problems let me know Follow up in 2-3 weeks for visit with labs that day

## 2015-06-06 NOTE — Progress Notes (Signed)
Pre visit review using our clinic review tool, if applicable. No additional management support is needed unless otherwise documented below in the visit note. 

## 2015-06-07 NOTE — Assessment & Plan Note (Signed)
bp is up after getting away from healthy habits and gaining wt  Pt is motivated to get back to former program Start back on lisinopril hct in the meantime for bp control F/u planned for re check and labs

## 2015-06-07 NOTE — Assessment & Plan Note (Signed)
Discussed how this problem influences overall health and the risks it imposes  Reviewed plan for weight loss with lower calorie diet (via better food choices and also portion control or program like weight watchers) and exercise building up to or more than 30 minutes 5 days per week including some aerobic activity   Pt plans to return to former plan that helped her loose wt in the past

## 2015-06-07 NOTE — Assessment & Plan Note (Signed)
Will re check a1c at next visit -will likely be up in light of wt gain and worse habits Disc wt loss/ low glycemic diet and exercise to prevent DM

## 2015-06-07 NOTE — Assessment & Plan Note (Signed)
Disc goals for lipids and reasons to control them Rev labs with pt from last draw  Rev low sat fat diet in detail  Will re check at fu appt

## 2015-06-27 ENCOUNTER — Ambulatory Visit (INDEPENDENT_AMBULATORY_CARE_PROVIDER_SITE_OTHER): Payer: 59 | Admitting: Family Medicine

## 2015-06-27 ENCOUNTER — Encounter: Payer: Self-pay | Admitting: Family Medicine

## 2015-06-27 VITALS — BP 128/82 | HR 105 | Temp 97.9°F | Ht <= 58 in | Wt 163.5 lb

## 2015-06-27 DIAGNOSIS — Z23 Encounter for immunization: Secondary | ICD-10-CM

## 2015-06-27 DIAGNOSIS — E785 Hyperlipidemia, unspecified: Secondary | ICD-10-CM | POA: Diagnosis not present

## 2015-06-27 DIAGNOSIS — R739 Hyperglycemia, unspecified: Secondary | ICD-10-CM | POA: Diagnosis not present

## 2015-06-27 DIAGNOSIS — I10 Essential (primary) hypertension: Secondary | ICD-10-CM | POA: Diagnosis not present

## 2015-06-27 NOTE — Patient Instructions (Addendum)
Labs today  BP looks better Gradually increase duration of exercise and then increase the resistance as tolerated  Watch your diet  Flu shot today

## 2015-06-27 NOTE — Progress Notes (Signed)
Subjective:    Patient ID: Jo Lamb, female    DOB: 18-Feb-1961, 54 y.o.   MRN: [id number]  HPI Here for f/u of chronic medical problems   Wt is the same  bmi of 34  Walking at work- 15 min on the treadmill per day - gets tired / is deconditioned  Eating differently - is on an isogenics program  Some cheating at birthday celebrations    Started back on lisinopril hct  bp is improved today  No cp or palpitations or headaches or edema  No side effects to medicines  BP Readings from Last 3 Encounters:  06/27/15 130/90  06/06/15 140/100  12/14/14 110/73     Otherwise feeling ok   Due for labs today   Patient Active Problem List   Diagnosis Date Noted  . Routine general medical examination at a health care facility 05/01/2014  . Hyperglycemia 11/14/2012  . Hand dermatitis 03/31/2011  . BACK PAIN WITH RADICULOPATHY 11/29/2008  . Essential hypertension 11/01/2008  . Obesity 08/11/2008  . GERD 08/11/2008  . Hyperlipidemia 07/07/2008  . NECK PAIN, CHRONIC 07/21/2007  . MIGRAINE HEADACHE 07/20/2007  . IBS 07/20/2007   Past Medical History  Diagnosis Date  . Colon polyps     colonoscopy- polyps, hemorrhoids, divertic  . History of migraine headaches   . Back pain   . Neck pain   . Hypertension     White coat  . IBS (irritable bowel syndrome)   . Fibroids   . Carpal tunnel syndrome on both sides 02/2004// 10/22/2004    Nerve conduction study x's 2   Past Surgical History  Procedure Laterality Date  . Vaginal delivery      x's two  . Tubal ligation    . Mri      MRI of head// Negative   Social History  Substance Use Topics  . Smoking status: Never Smoker   . Smokeless tobacco: Never Used  . Alcohol Use: No   Family History  Problem Relation Age of Onset  . Hypertension Father    No Known Allergies Current Outpatient Prescriptions on File Prior to Visit  Medication Sig Dispense Refill  . gabapentin (NEURONTIN) 300 MG capsule take 2 capsules by  mouth three times a day 180 capsule 11  . imipramine (TOFRANIL) 25 MG tablet take 2 tablets by mouth at bedtime 60 tablet 11  . lisinopril-hydrochlorothiazide (PRINZIDE,ZESTORETIC) 10-12.5 MG per tablet Take 1 tablet by mouth every morning. 30 tablet 11   No current facility-administered medications on file prior to visit.       Review of Systems Review of Systems  Constitutional: Negative for fever, appetite change, fatigue and unexpected weight change.  Eyes: Negative for pain and visual disturbance.  Respiratory: Negative for cough and shortness of breath.   Cardiovascular: Negative for cp or palpitations    Gastrointestinal: Negative for nausea, diarrhea and constipation.  Genitourinary: Negative for urgency and frequency.  Skin: Negative for pallor or rash   Neurological: Negative for weakness, light-headedness, numbness and headaches.  Hematological: Negative for adenopathy. Does not bruise/bleed easily.  Psychiatric/Behavioral: Negative for dysphoric mood. The patient is not nervous/anxious.         Objective:   Physical Exam  Constitutional: She appears well-developed and well-nourished. No distress.  obese and well appearing   HENT:  Head: Normocephalic and atraumatic.  Mouth/Throat: Oropharynx is clear and moist.  Eyes: Conjunctivae and EOM are normal. Pupils are equal, round, and reactive to light.  Neck: Normal range of motion. Neck supple. No JVD present. Carotid bruit is not present. No thyromegaly present.  Cardiovascular: Normal rate, regular rhythm, normal heart sounds and intact distal pulses.  Exam reveals no gallop.   Pulmonary/Chest: Effort normal and breath sounds normal. No respiratory distress. She has no wheezes. She has no rales.  No crackles  Abdominal: Soft. Bowel sounds are normal. She exhibits no distension, no abdominal bruit and no mass. There is no tenderness.  Musculoskeletal: She exhibits no edema.  Lymphadenopathy:    She has no cervical  adenopathy.  Neurological: She is alert. She has normal reflexes.  Skin: Skin is warm and dry. No rash noted.  Psychiatric: She has a normal mood and affect.          Assessment & Plan:   Problem List Items Addressed This Visit      Cardiovascular and Mediastinum   Essential hypertension - Primary    bp in fair control at this time  BP Readings from Last 1 Encounters:  06/27/15 128/82   No changes needed Disc lifstyle change with low sodium diet and exercise  Commended lifestyle change so far  Will continue lisinopril hct Lab today      Relevant Orders   CBC with Differential/Platelet (Completed)   Comprehensive metabolic panel (Completed)   TSH (Completed)     Other   Hyperglycemia    A1C today Working on wt loss and better habits       Relevant Orders   Hemoglobin A1c (Completed)   Hyperlipidemia    Lab today Disc goals for lipids and reasons to control them Rev labs with pt from last check  Rev low sat fat diet in detail       Relevant Orders   Lipid panel (Completed)    Other Visit Diagnoses    Need for prophylactic vaccination and inoculation against influenza        Relevant Orders    Flu Vaccine QUAD 36+ mos IM (Completed)

## 2015-06-27 NOTE — Progress Notes (Signed)
Pre visit review using our clinic review tool, if applicable. No additional management support is needed unless otherwise documented below in the visit note. 

## 2015-06-28 LAB — COMPREHENSIVE METABOLIC PANEL
ALBUMIN: 4.7 g/dL (ref 3.5–5.2)
ALT: 74 U/L — AB (ref 0–35)
AST: 46 U/L — AB (ref 0–37)
Alkaline Phosphatase: 83 U/L (ref 39–117)
BUN: 17 mg/dL (ref 6–23)
CHLORIDE: 99 meq/L (ref 96–112)
CO2: 33 mEq/L — ABNORMAL HIGH (ref 19–32)
CREATININE: 1.07 mg/dL (ref 0.40–1.20)
Calcium: 10.8 mg/dL — ABNORMAL HIGH (ref 8.4–10.5)
GFR: 56.58 mL/min — ABNORMAL LOW (ref >60.00)
Glucose, Bld: 98 mg/dL (ref 70–99)
Potassium: 4.2 mEq/L (ref 3.5–5.1)
Sodium: 139 mEq/L (ref 135–145)
Total Bilirubin: 0.5 mg/dL (ref 0.2–1.2)
Total Protein: 8.1 g/dL (ref 6.0–8.3)

## 2015-06-28 LAB — CBC WITH DIFFERENTIAL/PLATELET
BASOS ABS: 0 10*3/uL (ref 0.0–0.1)
Basophils Relative: 0.4 % (ref 0.0–3.0)
EOS ABS: 0.1 10*3/uL (ref 0.0–0.7)
Eosinophils Relative: 1.3 % (ref 0.0–5.0)
HCT: 44.5 % (ref 36.0–46.0)
HEMOGLOBIN: 14.8 g/dL (ref 12.0–15.0)
LYMPHS PCT: 33.5 % (ref 12.0–46.0)
Lymphs Abs: 3.1 10*3/uL (ref 0.7–4.0)
MCHC: 33.3 g/dL (ref 30.0–36.0)
MCV: 91.5 fl (ref 78.0–100.0)
MONO ABS: 0.8 10*3/uL (ref 0.1–1.0)
Monocytes Relative: 8.2 % (ref 3.0–12.0)
Neutro Abs: 5.3 10*3/uL (ref 1.4–7.7)
Neutrophils Relative %: 56.6 % (ref 43.0–77.0)
PLATELETS: 250 10*3/uL (ref 150.0–400.0)
RBC: 4.86 Mil/uL (ref 3.87–5.11)
RDW: 13.5 % (ref 11.5–15.5)
WBC: 9.4 10*3/uL (ref 4.0–10.5)

## 2015-06-28 LAB — LIPID PANEL
Cholesterol: 258 mg/dL — ABNORMAL HIGH (ref 0–200)
HDL: 49.2 mg/dL (ref >39.00)
NonHDL: 208.8
Total CHOL/HDL Ratio: 5
Triglycerides: 263 mg/dL — ABNORMAL HIGH (ref 0.0–149.0)
VLDL: 52.6 mg/dL — AB (ref 0.0–40.0)

## 2015-06-28 LAB — TSH: TSH: 2.06 u[IU]/mL (ref 0.35–4.50)

## 2015-06-28 LAB — HEMOGLOBIN A1C: Hgb A1c MFr Bld: 6.1 % (ref 4.6–6.5)

## 2015-06-28 LAB — LDL CHOLESTEROL, DIRECT: LDL DIRECT: 192 mg/dL

## 2015-06-28 NOTE — Assessment & Plan Note (Signed)
Lab today Disc goals for lipids and reasons to control them Rev labs with pt from last check  Rev low sat fat diet in detail  

## 2015-06-28 NOTE — Assessment & Plan Note (Signed)
A1C today Working on wt loss and better habits

## 2015-06-28 NOTE — Assessment & Plan Note (Signed)
bp in fair control at this time  BP Readings from Last 1 Encounters:  06/27/15 128/82   No changes needed Disc lifstyle change with low sodium diet and exercise  Commended lifestyle change so far  Will continue lisinopril hct Lab today

## 2016-04-05 ENCOUNTER — Emergency Department (HOSPITAL_COMMUNITY): Payer: BLUE CROSS/BLUE SHIELD

## 2016-04-05 ENCOUNTER — Encounter (HOSPITAL_COMMUNITY): Payer: Self-pay | Admitting: *Deleted

## 2016-04-05 ENCOUNTER — Emergency Department (HOSPITAL_COMMUNITY)
Admission: EM | Admit: 2016-04-05 | Discharge: 2016-04-06 | Disposition: A | Discharge status: Home / Self Care | Payer: BLUE CROSS/BLUE SHIELD | Attending: Emergency Medicine | Admitting: Emergency Medicine

## 2016-04-05 DIAGNOSIS — N201 Calculus of ureter: Secondary | ICD-10-CM

## 2016-04-05 DIAGNOSIS — N132 Hydronephrosis with renal and ureteral calculous obstruction: Secondary | ICD-10-CM | POA: Insufficient documentation

## 2016-04-05 DIAGNOSIS — Z79899 Other long term (current) drug therapy: Secondary | ICD-10-CM | POA: Diagnosis not present

## 2016-04-05 DIAGNOSIS — I10 Essential (primary) hypertension: Secondary | ICD-10-CM | POA: Insufficient documentation

## 2016-04-05 DIAGNOSIS — R109 Unspecified abdominal pain: Secondary | ICD-10-CM | POA: Diagnosis not present

## 2016-04-05 LAB — CBC WITH DIFFERENTIAL/PLATELET
BASOS ABS: 0 10*3/uL (ref 0.0–0.1)
BASOS PCT: 0 %
Eosinophils Absolute: 0 10*3/uL (ref 0.0–0.7)
Eosinophils Relative: 0 %
HEMATOCRIT: 44.6 % (ref 36.0–46.0)
HEMOGLOBIN: 14.6 g/dL (ref 12.0–15.0)
Lymphocytes Relative: 15 %
Lymphs Abs: 1.9 10*3/uL (ref 0.7–4.0)
MCH: 30.3 pg (ref 26.0–34.0)
MCHC: 32.7 g/dL (ref 30.0–36.0)
MCV: 92.5 fL (ref 78.0–100.0)
Monocytes Absolute: 0.7 10*3/uL (ref 0.1–1.0)
Monocytes Relative: 5 %
NEUTROS ABS: 9.6 10*3/uL — AB (ref 1.7–7.7)
NEUTROS PCT: 80 %
Platelets: 225 10*3/uL (ref 150–400)
RBC: 4.82 MIL/uL (ref 3.87–5.11)
RDW: 13 % (ref 11.5–15.5)
WBC: 12.1 10*3/uL — AB (ref 4.0–10.5)

## 2016-04-05 LAB — COMPREHENSIVE METABOLIC PANEL
ALBUMIN: 4.2 g/dL (ref 3.5–5.0)
ALT: 114 U/L — ABNORMAL HIGH (ref 14–54)
ANION GAP: 12 (ref 5–15)
AST: 91 U/L — ABNORMAL HIGH (ref 15–41)
Alkaline Phosphatase: 87 U/L (ref 38–126)
BILIRUBIN TOTAL: 0.5 mg/dL (ref 0.3–1.2)
BUN: 6 mg/dL (ref 6–20)
CO2: 22 mmol/L (ref 22–32)
Calcium: 9.5 mg/dL (ref 8.9–10.3)
Chloride: 105 mmol/L (ref 101–111)
Creatinine, Ser: 1.12 mg/dL — ABNORMAL HIGH (ref 0.44–1.00)
GFR calc Af Amer: >60 mL/min (ref >60)
GFR, EST NON AFRICAN AMERICAN: 54 mL/min — AB (ref >60)
GLUCOSE: 150 mg/dL — AB (ref 65–99)
POTASSIUM: 3.3 mmol/L — AB (ref 3.5–5.1)
Sodium: 139 mmol/L (ref 135–145)
TOTAL PROTEIN: 7.3 g/dL (ref 6.5–8.1)

## 2016-04-05 LAB — URINE MICROSCOPIC-ADD ON

## 2016-04-05 LAB — URINALYSIS, ROUTINE W REFLEX MICROSCOPIC
Bilirubin Urine: NEGATIVE
Glucose, UA: NEGATIVE mg/dL
Ketones, ur: 15 mg/dL — AB
Nitrite: POSITIVE — AB
Protein, ur: NEGATIVE mg/dL
Specific Gravity, Urine: 1.019 (ref 1.005–1.030)
pH: 6 (ref 5.0–8.0)

## 2016-04-05 LAB — LIPASE, BLOOD: Lipase: 21 U/L (ref 11–51)

## 2016-04-05 LAB — PREGNANCY, URINE: Preg Test, Ur: NEGATIVE

## 2016-04-05 MED ORDER — MORPHINE SULFATE (PF) 4 MG/ML IV SOLN
4.0000 mg | Freq: Once | INTRAVENOUS | Status: AC
  Administered 2016-04-05: 4 mg via INTRAVENOUS
  Filled 2016-04-05: qty 1

## 2016-04-05 MED ORDER — HYDROMORPHONE HCL 1 MG/ML IJ SOLN
1.0000 mg | Freq: Once | INTRAMUSCULAR | Status: AC
  Administered 2016-04-05: 1 mg via INTRAVENOUS
  Filled 2016-04-05: qty 1

## 2016-04-05 MED ORDER — ONDANSETRON HCL 4 MG/2ML IJ SOLN
4.0000 mg | Freq: Once | INTRAMUSCULAR | Status: AC
  Administered 2016-04-05: 4 mg via INTRAVENOUS
  Filled 2016-04-05: qty 2

## 2016-04-05 NOTE — ED Notes (Addendum)
Pt reports right mid back pain that started yesterday lasting about 30 minutes. Today pain on the right side that goes around to the right side lower abdomen. Pt c/o pain when she feels like she has too urinate. Started taking AZO yesterday. Pt denies fevers, intermittent nausea.

## 2016-04-05 NOTE — ED Notes (Signed)
Patient transported to CT 

## 2016-04-05 NOTE — ED Provider Notes (Signed)
CSN: [id number]     Arrival date & time 04/05/16  1726 History   First MD Initiated Contact with Patient 04/05/16 1925     Chief Complaint  Patient presents with  . Back Pain     (Consider location/radiation/quality/duration/timing/severity/associated sxs/prior Treatment) HPI Comments: 55 year old female who presents with right flank pain. The patient had an episode of right flank pain yesterday that lasted approximately 30 minutes and then spontaneously resolved. Today around 2:30pm, the right flank pain returned and now radiates around to her right side and right lower abdomen. The pain has been severe and constant this evening. She began having some dysuria yesterday and started taking AZO. She has had intermittent nausea but no vomiting. No fevers or diarrhea. She has never had pain like this before and denies any history of kidney stones. No chest pain or shortness of breath.  Patient is a 55 y.o. female presenting with back pain. The history is provided by the patient.  Back Pain   Past Medical History  Diagnosis Date  . Colon polyps     colonoscopy- polyps, hemorrhoids, divertic  . History of migraine headaches   . Back pain   . Neck pain   . Hypertension     White coat  . IBS (irritable bowel syndrome)   . Fibroids   . Carpal tunnel syndrome on both sides 02/2004// 10/22/2004    Nerve conduction study x's 2   Past Surgical History  Procedure Laterality Date  . Vaginal delivery      x's two  . Tubal ligation    . Mri      MRI of head// Negative   Family History  Problem Relation Age of Onset  . Hypertension Father    Social History  Substance Use Topics  . Smoking status: Never Smoker   . Smokeless tobacco: Never Used  . Alcohol Use: No   OB History    No data available     Review of Systems  Musculoskeletal: Positive for back pain.   10 Systems reviewed and are negative for acute change except as noted in the HPI.    Allergies  Review of patient's  allergies indicates no known allergies.  Home Medications   Prior to Admission medications   Medication Sig Start Date End Date Taking? Authorizing Provider  gabapentin (NEURONTIN) 300 MG capsule take 2 capsules by mouth three times a day Patient taking differently: Take 600 mg by mouth at bedtime.  06/06/15  Yes Abner Greenspan, MD  ibuprofen (ADVIL,MOTRIN) 200 MG tablet Take 200 mg by mouth every 6 (six) hours as needed (pain).   Yes Historical Provider, MD  imipramine (TOFRANIL) 25 MG tablet take 2 tablets by mouth at bedtime Patient taking differently: Take 50 mg by mouth at bedtime.  06/06/15  Yes Abner Greenspan, MD  lisinopril-hydrochlorothiazide (PRINZIDE,ZESTORETIC) 10-12.5 MG per tablet Take 1 tablet by mouth every morning. Patient taking differently: Take 1 tablet by mouth daily.  06/06/15  Yes Abner Greenspan, MD  Phenazopyridine HCl (AZO TABS PO) Take 2 tablets by mouth 2 (two) times daily as needed (pain).   Yes Historical Provider, MD   BP 175/103 mmHg  Pulse 98  Temp(Src) 97.9 F (36.6 C) (Oral)  Resp 22  Ht 4\' 11"  (1.499 m)  Wt 170 lb (77.111 kg)  BMI 34.32 kg/m2  SpO2 96% Physical Exam  Constitutional: She is oriented to person, place, and time. She appears well-developed and well-nourished. She appears distressed.  In pain  HENT:  Head: Normocephalic and atraumatic.  Moist mucous membranes  Eyes: Conjunctivae are normal.  Neck: Neck supple.  Cardiovascular: Normal rate, regular rhythm and normal heart sounds.   No murmur heard. Pulmonary/Chest: Effort normal and breath sounds normal.  Abdominal: Soft. Bowel sounds are normal. She exhibits no distension. There is tenderness (mild RLQ tenderness). There is no rebound and no guarding.  Genitourinary:  No CVA tenderness  Musculoskeletal: She exhibits no edema.  Neurological: She is alert and oriented to person, place, and time.  Fluent speech  Skin: Skin is warm and dry.  Psychiatric: She has a normal mood and affect.  Judgment normal.  Nursing note and vitals reviewed.   ED Course  Procedures (including critical care time) Labs Review Labs Reviewed  URINALYSIS, ROUTINE W REFLEX MICROSCOPIC (NOT AT [facility]) - Abnormal; Notable for the following:    Color, Urine ORANGE (*)    Hgb urine dipstick MODERATE (*)    Ketones, ur 15 (*)    Nitrite POSITIVE (*)    Leukocytes, UA SMALL (*)    All other components within normal limits  URINE MICROSCOPIC-ADD ON - Abnormal; Notable for the following:    Squamous Epithelial / LPF 0-5 (*)    Bacteria, UA RARE (*)    All other components within normal limits  COMPREHENSIVE METABOLIC PANEL - Abnormal; Notable for the following:    Potassium 3.3 (*)    Glucose, Bld 150 (*)    Creatinine, Ser 1.12 (*)    AST 91 (*)    ALT 114 (*)    GFR calc non Af Amer 54 (*)    All other components within normal limits  CBC WITH DIFFERENTIAL/PLATELET - Abnormal; Notable for the following:    WBC 12.1 (*)    Neutro Abs 9.6 (*)    All other components within normal limits  URINE CULTURE  LIPASE, BLOOD  PREGNANCY, URINE    Imaging Review Ct Renal Stone Study  04/05/2016  CLINICAL DATA:  Right flank pain radiating to the right upper and right lower quadrants. Trace hematuria. EXAM: CT ABDOMEN AND PELVIS WITHOUT CONTRAST TECHNIQUE: Multidetector CT imaging of the abdomen and pelvis was performed following the standard protocol without IV contrast. COMPARISON:  None. FINDINGS: Lower chest:  Minimal dependent atelectasis. No pleural fluid. Liver: Decreased density consistent with steatosis. Fatty sparing adjacent to the gallbladder fossa. No evidence of focal lesion. Hepatobiliary: Gallbladder physiologically distended, no calcified stone. Question of intraluminal sludge. No biliary dilatation. Pancreas: No ductal dilatation or inflammation. Spleen: Normal. Adrenal glands: No nodule. Kidneys: Obstructing 2 mm stone at the right ureterovesicular junction with moderate hydroureteronephrosis  and perinephric stranding. No left hydronephrosis. No additional nonobstructing uropathy. Stomach/Bowel: Stomach physiologically distended. There are no dilated or thickened small bowel loops. Small volume of stool throughout the colon without colonic wall thickening. Colonic diverticulosis in the distal colon without diverticulitis. The appendix is normal. Vascular/Lymphatic: No retroperitoneal adenopathy. Abdominal aorta is normal in caliber. Reproductive: Uterus and adnexa are normal for age. Bladder: Physiologically distended, no wall thickening. Other: No free air, free fluid, or intra-abdominal fluid collection. Tiny fat containing umbilical hernia. Musculoskeletal: There are no acute or suspicious osseous abnormalities. Degenerative change in the spine and both sacroiliac joints. IMPRESSION: 1. Obstructing 2 mm stone at the right ureterovesicular junction with moderate hydronephrosis and perinephric stranding. 2. Incidental findings of hepatic steatosis and sigmoid colonic diverticulosis. Electronically Signed   By: Jeb Levering M.D.   On: 04/05/2016 22:41   I have  personally reviewed and evaluated these lab results as part of my medical decision-making.   EKG Interpretation None     Medications  ketorolac (TORADOL) 30 MG/ML injection 30 mg (not administered)  oxyCODONE-acetaminophen (PERCOCET/ROXICET) 5-325 MG per tablet 2 tablet (not administered)  ondansetron (ZOFRAN) injection 4 mg (4 mg Intravenous Given 04/05/16 2021)  morphine 4 MG/ML injection 4 mg (4 mg Intravenous Given 04/05/16 2021)  morphine 4 MG/ML injection 4 mg (4 mg Intravenous Given 04/05/16 2135)  HYDROmorphone (DILAUDID) injection 1 mg (1 mg Intravenous Given 04/05/16 2147)    MDM   Final diagnoses:  None    Patient presents with 1 day of right flank pain radiating to her right side associated with nausea. She was in moderate distress due to pain on exam. No CVA tenderness, mild right lower quadrant pain however patient  reports that the predominance of her pain is in her back. Obtained above lab work which shows creatinine 1.120, BUN 6, AST 91, ALT 114, WBC 12.1. The patient has had a mildly elevated LFTs in the past, discussed importance of follow-up with PCP regarding these abnormal labs. No focal right upper quadrant tenderness to suggest gallbladder pathology. UA contains small leukocytes, no significant WBCs or bacteria to suggest infection. Urine is nitrite positive however I suspect false positive due to patient's AZO use. Culture sent. CT renal study shows obstructing 66mm stone at right UVJ w/ moderate hydronephrosis. On reexamination after receiving morphine and later Dilaudid, the patient was well-appearing and much more comfortable. I discussed findings and given the small size of the stone and distal location, I feel it will likely pass spontaneously without intervention. She has been afebrile and I feel she is safe for discharge. I have discussed supportive care and gave dose of Percocet and Toradol here as well as prescription for Percocet and Zofran to use at home. Provided with urology follow-up. Extensively reviewed return precautions including fever, intractable pain, or any new symptoms. Patient voiced understanding and was discharged in satisfactory condition.  Sharlett Iles, MD 04/06/16 [phone]

## 2016-04-06 MED ORDER — ONDANSETRON 4 MG PO TBDP: 4.0000 mg | ORAL_TABLET | Freq: Three times a day (TID) | ORAL | Status: DC | PRN

## 2016-04-06 MED ORDER — OXYCODONE-ACETAMINOPHEN 5-325 MG PO TABS: 1.0000 | ORAL_TABLET | ORAL | Status: DC | PRN

## 2016-04-06 MED ORDER — OXYCODONE-ACETAMINOPHEN 5-325 MG PO TABS
2.0000 | ORAL_TABLET | Freq: Once | ORAL | Status: AC
  Administered 2016-04-06: 2 via ORAL
  Filled 2016-04-06: qty 2

## 2016-04-06 MED ORDER — KETOROLAC TROMETHAMINE 30 MG/ML IJ SOLN
30.0000 mg | Freq: Once | INTRAMUSCULAR | Status: AC
  Administered 2016-04-06: 30 mg via INTRAVENOUS
  Filled 2016-04-06: qty 1

## 2016-04-06 NOTE — Discharge Instructions (Signed)
Kidney Stones °Kidney stones (urolithiasis) are deposits that form inside your kidneys. The intense pain is caused by the stone moving through the urinary tract. When the stone moves, the ureter goes into spasm around the stone. The stone is usually passed in the urine.  °CAUSES  °· A disorder that makes certain neck glands produce too much parathyroid hormone (primary hyperparathyroidism). °· A buildup of uric acid crystals, similar to gout in your joints. °· Narrowing (stricture) of the ureter. °· A kidney obstruction present at birth (congenital obstruction). °· Previous surgery on the kidney or ureters. °· Numerous kidney infections. °SYMPTOMS  °· Feeling sick to your stomach (nauseous). °· Throwing up (vomiting). °· Blood in the urine (hematuria). °· Pain that usually spreads (radiates) to the groin. °· Frequency or urgency of urination. °DIAGNOSIS  °· Taking a history and physical exam. °· Blood or urine tests. °· CT scan. °· Occasionally, an examination of the inside of the urinary bladder (cystoscopy) is performed. °TREATMENT  °· Observation. °· Increasing your fluid intake. °· Extracorporeal shock wave lithotripsy--This is a noninvasive procedure that uses shock waves to break up kidney stones. °· Surgery may be needed if you have severe pain or persistent obstruction. There are various surgical procedures. Most of the procedures are performed with the use of small instruments. Only small incisions are needed to accommodate these instruments, so recovery time is minimized. °The size, location, and chemical composition are all important variables that will determine the proper choice of action for you. Talk to your health care provider to better understand your situation so that you will minimize the risk of injury to yourself and your kidney.  °HOME CARE INSTRUCTIONS  °· Drink enough water and fluids to keep your urine clear or pale yellow. This will help you to pass the stone or stone fragments. °· Strain  all urine through the provided strainer. Keep all particulate matter and stones for your health care provider to see. The stone causing the pain may be as small as a grain of salt. It is very important to use the strainer each and every time you pass your urine. The collection of your stone will allow your health care provider to analyze it and verify that a stone has actually passed. The stone analysis will often identify what you can do to reduce the incidence of recurrences. °· Only take over-the-counter or prescription medicines for pain, discomfort, or fever as directed by your health care provider. °· Keep all follow-up visits as told by your health care provider. This is important. °· Get follow-up X-rays if required. The absence of pain does not always mean that the stone has passed. It may have only stopped moving. If the urine remains completely obstructed, it can cause loss of kidney function or even complete destruction of the kidney. It is your responsibility to make sure X-rays and follow-ups are completed. Ultrasounds of the kidney can show blockages and the status of the kidney. Ultrasounds are not associated with any radiation and can be performed easily in a matter of minutes. °· Make changes to your daily diet as told by your health care provider. You may be told to: °¨ Limit the amount of salt that you eat. °¨ Eat 5 or more servings of fruits and vegetables each day. °¨ Limit the amount of meat, poultry, fish, and eggs that you eat. °· Collect a 24-hour urine sample as told by your health care provider. You may need to collect another urine sample every 6-12   months. °SEEK MEDICAL CARE IF: °· You experience pain that is progressive and unresponsive to any pain medicine you have been prescribed. °SEEK IMMEDIATE MEDICAL CARE IF:  °· Pain cannot be controlled with the prescribed medicine. °· You have a fever or shaking chills. °· The severity or intensity of pain increases over 18 hours and is not  relieved by pain medicine. °· You develop a new onset of abdominal pain. °· You feel faint or pass out. °· You are unable to urinate. °  °This information is not intended to replace advice given to you by your health care provider. Make sure you discuss any questions you have with your health care provider. °  °Document Released: 09/15/2005 Document Revised: 06/06/2015 Document Reviewed: 02/16/2013 °Elsevier Interactive Patient Education ©2016 [employer] ° °

## 2016-04-07 LAB — URINE CULTURE
CULTURE: NO GROWTH
Special Requests: NORMAL

## 2016-04-10 DIAGNOSIS — N201 Calculus of ureter: Secondary | ICD-10-CM | POA: Diagnosis not present

## 2016-06-04 ENCOUNTER — Other Ambulatory Visit: Payer: Self-pay | Admitting: Family Medicine

## 2016-06-05 NOTE — Telephone Encounter (Signed)
Appointment  10/16 Pt aware

## 2016-06-05 NOTE — Telephone Encounter (Signed)
Pt hasn't been seen in almost a year, and no future appts, please advise

## 2016-06-05 NOTE — Telephone Encounter (Signed)
Please schedule 30 min f/u for the fall and refill until then

## 2016-06-05 NOTE — Telephone Encounter (Signed)
Med refilled.

## 2016-06-05 NOTE — Telephone Encounter (Signed)
Left voicemail requesting pt to call the office back 

## 2016-07-14 ENCOUNTER — Ambulatory Visit (INDEPENDENT_AMBULATORY_CARE_PROVIDER_SITE_OTHER): Payer: BLUE CROSS/BLUE SHIELD | Admitting: Family Medicine

## 2016-07-14 ENCOUNTER — Encounter: Payer: Self-pay | Admitting: Family Medicine

## 2016-07-14 VITALS — BP 130/82 | HR 101 | Temp 98.3°F | Ht <= 58 in | Wt 174.5 lb

## 2016-07-14 DIAGNOSIS — E7800 Pure hypercholesterolemia, unspecified: Secondary | ICD-10-CM

## 2016-07-14 DIAGNOSIS — E6609 Other obesity due to excess calories: Secondary | ICD-10-CM

## 2016-07-14 DIAGNOSIS — R739 Hyperglycemia, unspecified: Secondary | ICD-10-CM | POA: Diagnosis not present

## 2016-07-14 DIAGNOSIS — I10 Essential (primary) hypertension: Secondary | ICD-10-CM | POA: Diagnosis not present

## 2016-07-14 DIAGNOSIS — Z23 Encounter for immunization: Secondary | ICD-10-CM

## 2016-07-14 DIAGNOSIS — IMO0001 Reserved for inherently not codable concepts without codable children: Secondary | ICD-10-CM

## 2016-07-14 DIAGNOSIS — Z6836 Body mass index (BMI) 36.0-36.9, adult: Secondary | ICD-10-CM

## 2016-07-14 LAB — CBC WITH DIFFERENTIAL/PLATELET
BASOS ABS: 0 10*3/uL (ref 0.0–0.1)
Basophils Relative: 0.4 % (ref 0.0–3.0)
EOS ABS: 0 10*3/uL (ref 0.0–0.7)
Eosinophils Relative: 0.2 % (ref 0.0–5.0)
HCT: 41.9 % (ref 36.0–46.0)
Hemoglobin: 14.2 g/dL (ref 12.0–15.0)
LYMPHS ABS: 2.3 10*3/uL (ref 0.7–4.0)
Lymphocytes Relative: 36.8 % (ref 12.0–46.0)
MCHC: 33.8 g/dL (ref 30.0–36.0)
MCV: 90.3 fl (ref 78.0–100.0)
MONO ABS: 0.4 10*3/uL (ref 0.1–1.0)
MONOS PCT: 6.9 % (ref 3.0–12.0)
NEUTROS ABS: 3.4 10*3/uL (ref 1.4–7.7)
NEUTROS PCT: 55.7 % (ref 43.0–77.0)
PLATELETS: 211 10*3/uL (ref 150.0–400.0)
RBC: 4.64 Mil/uL (ref 3.87–5.11)
RDW: 13.2 % (ref 11.5–15.5)
WBC: 6.1 10*3/uL (ref 4.0–10.5)

## 2016-07-14 LAB — COMPREHENSIVE METABOLIC PANEL
ALK PHOS: 82 U/L (ref 39–117)
ALT: 84 U/L — AB (ref 0–35)
AST: 55 U/L — ABNORMAL HIGH (ref 0–37)
Albumin: 4.7 g/dL (ref 3.5–5.2)
BILIRUBIN TOTAL: 0.6 mg/dL (ref 0.2–1.2)
BUN: 11 mg/dL (ref 6–23)
CO2: 31 meq/L (ref 19–32)
Calcium: 10.1 mg/dL (ref 8.4–10.5)
Chloride: 100 mEq/L (ref 96–112)
Creatinine, Ser: 1.1 mg/dL (ref 0.40–1.20)
GFR: 54.59 mL/min — AB (ref >60.00)
GLUCOSE: 115 mg/dL — AB (ref 70–99)
Potassium: 4.1 mEq/L (ref 3.5–5.1)
SODIUM: 139 meq/L (ref 135–145)
TOTAL PROTEIN: 7.7 g/dL (ref 6.0–8.3)

## 2016-07-14 LAB — HEMOGLOBIN A1C: Hgb A1c MFr Bld: 6.3 % (ref 4.6–6.5)

## 2016-07-14 LAB — LIPID PANEL
CHOL/HDL RATIO: 5
CHOLESTEROL: 235 mg/dL — AB (ref 0–200)
HDL: 47 mg/dL (ref >39.00)
LDL CALC: 158 mg/dL — AB (ref 0–99)
NonHDL: 188.43
Triglycerides: 150 mg/dL — ABNORMAL HIGH (ref 0.0–149.0)
VLDL: 30 mg/dL (ref 0.0–40.0)

## 2016-07-14 LAB — TSH: TSH: 2.95 u[IU]/mL (ref 0.35–4.50)

## 2016-07-14 MED ORDER — LISINOPRIL-HYDROCHLOROTHIAZIDE 10-12.5 MG PO TABS: 1.0000 | ORAL_TABLET | Freq: Every day | ORAL | 11 refills | Status: DC

## 2016-07-14 MED ORDER — IMIPRAMINE HCL 25 MG PO TABS: 50.0000 mg | ORAL_TABLET | Freq: Every day | ORAL | 11 refills | Status: DC

## 2016-07-14 MED ORDER — GABAPENTIN 300 MG PO CAPS: 600.0000 mg | ORAL_CAPSULE | Freq: Every day | ORAL | 11 refills | Status: DC

## 2016-07-14 NOTE — Assessment & Plan Note (Signed)
Due for A1C Labs today  Eating more carbs/sweets-disc risks of DM Rev a low glycemic diet and plan for exercise

## 2016-07-14 NOTE — Progress Notes (Signed)
Subjective:    Patient ID: Jo Lamb, female    DOB: [birth year], 55 y.o.   MRN: [id number]  HPI Here for f/u of chronic health problems   Doing ok  Working a lot   [employer] from Last 3 Encounters:  07/14/16 174 lb 8 oz (79.2 kg)  04/05/16 170 lb (77.1 kg)  06/27/15 163 lb 8 oz (74.2 kg)  not doing well with diet and exercise  Lost her motivation  In general she wants to feel better and move more and prevent DM bmi is 36.7  Is addicted to sweets and breads and carbs in general  Off and on  Also eats for convenience   Not a lot of time for exercise  She is really tired when she gets home from work    She lost wt with isogenics program in the past - shake system does not work long term   bp is stable today  No cp or palpitations or headaches or edema  No side effects to medicines  BP Readings from Last 3 Encounters:  07/14/16 134/82  04/06/16 131/85  06/27/15 128/82       Chemistry      Component Value Date/Time   NA 139 04/05/2016 2025   K 3.3 (L) 04/05/2016 2025   CL 105 04/05/2016 2025   CO2 22 04/05/2016 2025   BUN 6 04/05/2016 2025   CREATININE 1.12 (H) 04/05/2016 2025      Component Value Date/Time   CALCIUM 9.5 04/05/2016 2025   ALKPHOS 87 04/05/2016 2025   AST 91 (H) 04/05/2016 2025   ALT 114 (H) 04/05/2016 2025   BILITOT 0.5 04/05/2016 2025       Hx of hyperglycemia Lab Results  Component Value Date   HGBA1C 6.1 06/27/2015   Hx of hyperlipidemia Lab Results  Component Value Date   CHOL 258 (H) 06/27/2015   HDL 49.20 06/27/2015   LDLCALC 120 (H) 05/02/2014   LDLDIRECT 192.0 06/27/2015   TRIG 263.0 (H) 06/27/2015   CHOLHDL 5 06/27/2015   she was on statin in the past - stopped it out of fear of cholesterol meds  She did not f/u as planned for this  She eats some fatty foods Jo Lamb Jo Lamb   Patient Active Problem List   Diagnosis Date Noted  . Routine general medical examination at a health care facility 05/01/2014  .  Hyperglycemia 11/14/2012  . Hand dermatitis 03/31/2011  . BACK PAIN WITH RADICULOPATHY 11/29/2008  . Essential hypertension 11/01/2008  . Obesity 08/11/2008  . GERD 08/11/2008  . Hyperlipidemia 07/07/2008  . NECK PAIN, CHRONIC 07/21/2007  . MIGRAINE HEADACHE 07/20/2007  . IBS 07/20/2007   Past Medical History:  Diagnosis Date  . Back pain   . Carpal tunnel syndrome on both sides 02/2004// 10/22/2004   Nerve conduction study x's 2  . Colon polyps    colonoscopy- polyps, hemorrhoids, divertic  . Fibroids   . History of migraine headaches   . Hypertension    White coat  . IBS (irritable bowel syndrome)   . Neck pain    Past Surgical History:  Procedure Laterality Date  . MRI     MRI of head// Negative  . TUBAL LIGATION    . VAGINAL DELIVERY     x's two   Social History  Substance Use Topics  . Smoking status: Never Smoker  . Smokeless tobacco: Never Used  . Alcohol use No   Family History  Problem Relation Age  of Onset  . Hypertension Father    No Known Allergies No current outpatient prescriptions on file prior to visit.   No current facility-administered medications on file prior to visit.      Review of Systems Review of Systems  Constitutional: Negative for fever, appetite change,  and unexpected weight change. pos for fatigue  Eyes: Negative for pain and visual disturbance.  Respiratory: Negative for cough and shortness of breath.   Cardiovascular: Negative for cp or palpitations    Gastrointestinal: Negative for nausea, diarrhea and constipation.  Genitourinary: Negative for urgency and frequency.  Skin: Negative for pallor or rash   Neurological: Negative for weakness, light-headedness, numbness and headaches.  Hematological: Negative for adenopathy. Does not bruise/bleed easily.  Psychiatric/Behavioral: Negative for dysphoric mood. The patient is not nervous/anxious.         Objective:   Physical Exam  Constitutional: She appears well-developed  and well-nourished. No distress.  obese and well appearing   HENT:  Head: Normocephalic and atraumatic.  Mouth/Throat: Oropharynx is clear and moist.  Eyes: Conjunctivae and EOM are normal. Pupils are equal, round, and reactive to light.  Neck: Normal range of motion. Neck supple. No JVD present. Carotid bruit is not present. No thyromegaly present.  Cardiovascular: Normal rate, regular rhythm, normal heart sounds and intact distal pulses.  Exam reveals no gallop.   Pulmonary/Chest: Effort normal and breath sounds normal. No respiratory distress. She has no wheezes. She has no rales.  No crackles  Abdominal: Soft. Bowel sounds are normal. She exhibits no distension, no abdominal bruit and no mass. There is no tenderness.  Musculoskeletal: She exhibits no edema.  Lymphadenopathy:    She has no cervical adenopathy.  Neurological: She is alert. She has normal reflexes.  Skin: Skin is warm and dry. No rash noted.  Psychiatric: She has a normal mood and affect.          Assessment & Plan:   Problem List Items Addressed This Visit      Cardiovascular and Mediastinum   Essential hypertension - Primary    bp in fair control at this time  BP Readings from Last 1 Encounters:  07/14/16 130/82   No changes needed Disc lifstyle change with low sodium diet and exercise  Labs today  Enc wt loss and exercise program      Relevant Medications   lisinopril-hydrochlorothiazide (PRINZIDE,ZESTORETIC) 10-12.5 MG tablet   Other Relevant Orders   CBC with Differential/Platelet (Completed)   Comprehensive metabolic panel (Completed)   TSH (Completed)   Lipid panel (Completed)     Other   Hyperglycemia    Due for A1C Labs today  Eating more carbs/sweets-disc risks of DM Rev a low glycemic diet and plan for exercise       Relevant Orders   Hemoglobin A1c (Completed)   Hyperlipidemia    Lipid panel today  Pt stopped statin due to fear of it in the past- suspect she may re consider if  no improvement  Rev last lipid panel and low trans and sat fat diet       Relevant Medications   lisinopril-hydrochlorothiazide (PRINZIDE,ZESTORETIC) 10-12.5 MG tablet   Other Relevant Orders   Lipid panel (Completed)   Obesity    Discussed how this problem influences overall health and the risks it imposes  Reviewed plan for weight loss with lower calorie diet (via better food choices and also portion control or program like weight watchers) and exercise building up to or more than 30  minutes 5 days per week including some aerobic activity   Disc ways to start an exercise program and cut sweets and portions of simple carbs        Other Visit Diagnoses    Need for influenza vaccination       Relevant Orders   Flu Vaccine QUAD 36+ mos IM (Completed)

## 2016-07-14 NOTE — Assessment & Plan Note (Signed)
Discussed how this problem influences overall health and the risks it imposes  Reviewed plan for weight loss with lower calorie diet (via better food choices and also portion control or program like weight watchers) and exercise building up to or more than 30 minutes 5 days per week including some aerobic activity   Disc ways to start an exercise program and cut sweets and portions of simple carbs

## 2016-07-14 NOTE — Assessment & Plan Note (Signed)
bp in fair control at this time  BP Readings from Last 1 Encounters:  07/14/16 130/82   No changes needed Disc lifstyle change with low sodium diet and exercise  Labs today  Enc wt loss and exercise program

## 2016-07-14 NOTE — Progress Notes (Signed)
Pre visit review using our clinic review tool, if applicable. No additional management support is needed unless otherwise documented below in the visit note. 

## 2016-07-14 NOTE — Assessment & Plan Note (Signed)
Lipid panel today  Pt stopped statin due to fear of it in the past- suspect she may re consider if no improvement  Rev last lipid panel and low trans and sat fat diet

## 2016-07-14 NOTE — Patient Instructions (Addendum)
Try to work up to exercise 30 minutes five days per week  Also make and pack meals  Find things to motivate you  Consider getting rid of processed carbs as much as possible to see if you can loose the cravings  Drink lots of water  Lab today  Flu shot today

## 2016-08-05 ENCOUNTER — Other Ambulatory Visit: Payer: Self-pay | Admitting: Family Medicine

## 2016-10-21 ENCOUNTER — Encounter: Payer: Self-pay | Admitting: Family Medicine

## 2016-10-21 ENCOUNTER — Ambulatory Visit (INDEPENDENT_AMBULATORY_CARE_PROVIDER_SITE_OTHER): Payer: BLUE CROSS/BLUE SHIELD | Admitting: Family Medicine

## 2016-10-21 VITALS — BP 132/94 | HR 110 | Temp 98.6°F | Wt 174.5 lb

## 2016-10-21 DIAGNOSIS — H6690 Otitis media, unspecified, unspecified ear: Secondary | ICD-10-CM | POA: Diagnosis not present

## 2016-10-21 MED ORDER — AMOXICILLIN 875 MG PO TABS: 875.0000 mg | ORAL_TABLET | Freq: Two times a day (BID) | ORAL | 0 refills | Status: DC

## 2016-10-21 MED ORDER — BENZONATATE 100 MG PO CAPS: 200.0000 mg | ORAL_CAPSULE | Freq: Three times a day (TID) | ORAL | 0 refills | Status: DC | PRN

## 2016-10-21 NOTE — Progress Notes (Signed)
SUBJECTIVE:  Jo Lamb is a 56 y.o. female pt of Dr. Glori Bickers new to me,  who complains of coryza, congestion, sneezing, sore throat, productive cough and bilateral sinus pain for 5 days. She denies a history of anorexia and chest pain and denies a history of asthma. Patient denies smoke cigarettes.   Current Outpatient Prescriptions on File Prior to Visit  Medication Sig Dispense Refill  . gabapentin (NEURONTIN) 300 MG capsule Take 2 capsules (600 mg total) by mouth at bedtime. 60 capsule 11  . imipramine (TOFRANIL) 25 MG tablet Take 2 tablets (50 mg total) by mouth at bedtime. 60 tablet 11  . lisinopril-hydrochlorothiazide (PRINZIDE,ZESTORETIC) 10-12.5 MG tablet Take 1 tablet by mouth daily. 30 tablet 11   No current facility-administered medications on file prior to visit.     No Known Allergies  Past Medical History:  Diagnosis Date  . Back pain   . Carpal tunnel syndrome on both sides 02/2004// 10/22/2004   Nerve conduction study x's 2  . Colon polyps    colonoscopy- polyps, hemorrhoids, divertic  . Fibroids   . History of migraine headaches   . Hypertension    White coat  . IBS (irritable bowel syndrome)   . Neck pain     Past Surgical History:  Procedure Laterality Date  . MRI     MRI of head// Negative  . TUBAL LIGATION    . VAGINAL DELIVERY     x's two    Family History  Problem Relation Age of Onset  . Hypertension Father     Social History   Social History  . Marital status: Single    Spouse name: N/A  . Number of children: N/A  . Years of education: N/A   Occupational History  . Not on file.   Social History Main Topics  . Smoking status: Never Smoker  . Smokeless tobacco: Never Used  . Alcohol use No  . Drug use: No  . Sexual activity: Not on file   Other Topics Concern  . Not on file   Social History Narrative   Occasionally caffeine- nor more than 1 serv/ day   The PMH, PSH, Social History, Family History, Medications, and  allergies have been reviewed in [facility], and have been updated if relevant.  OBJECTIVE: BP (!) 132/94   Pulse (!) 110   Temp 98.6 F (37 C) (Oral)   Wt 174 lb 8 oz (79.2 kg)   LMP 12/29/2010   SpO2 94%   BMI 36.79 kg/m   She appears well, vital signs are as noted. Right TM buldging, red, left Tm dull.  Throat and pharynx normal.  Neck supple. No adenopathy in the neck. Nose is congested. Sinuses non tender. The chest is clear, without wheezes or rales.  ASSESSMENT:  otitis media  PLAN: Amoxicillin twice daily x 10 days. Symptomatic therapy suggested: push fluids, rest and return office visit prn if symptoms persist or worsen. . Call or return to clinic prn if these symptoms worsen or fail to improve as anticipated.

## 2016-10-21 NOTE — Progress Notes (Signed)
Pre visit review using our clinic review tool, if applicable. No additional management support is needed unless otherwise documented below in the visit note. 

## 2016-11-04 ENCOUNTER — Ambulatory Visit (INDEPENDENT_AMBULATORY_CARE_PROVIDER_SITE_OTHER): Payer: BLUE CROSS/BLUE SHIELD | Admitting: Internal Medicine

## 2016-11-04 ENCOUNTER — Encounter: Payer: Self-pay | Admitting: Internal Medicine

## 2016-11-04 VITALS — BP 138/92 | HR 90 | Temp 98.2°F | Wt 176.0 lb

## 2016-11-04 DIAGNOSIS — J069 Acute upper respiratory infection, unspecified: Secondary | ICD-10-CM | POA: Diagnosis not present

## 2016-11-04 DIAGNOSIS — B9789 Other viral agents as the cause of diseases classified elsewhere: Secondary | ICD-10-CM

## 2016-11-04 MED ORDER — HYDROCODONE-HOMATROPINE 5-1.5 MG/5ML PO SYRP: 5.0000 mL | ORAL_SOLUTION | Freq: Three times a day (TID) | ORAL | 0 refills | Status: DC | PRN

## 2016-11-04 MED ORDER — PREDNISONE 10 MG PO TABS: ORAL_TABLET | ORAL | 0 refills | Status: DC

## 2016-11-04 NOTE — Progress Notes (Signed)
HPI  Pt presents to the clinic today with c/o ear fullness, sore throat and cough. This started 2-3 days ago. She denies ear pain or decreased hearing. She denies difficulty swallowing. The cough is nonproductive. She denies runny nose or nasal congestion. She denies fever, chills or body aches. She has not tried any OTC meds. She has not had sick contacts that she is aware of. She was seen 1/23, diagnosed with an ear infection and placed on a 10 day course of antibiotics.  Review of Systems        Past Medical History:  Diagnosis Date  . Back pain   . Carpal tunnel syndrome on both sides 02/2004// 10/22/2004   Nerve conduction study x's 2  . Colon polyps    colonoscopy- polyps, hemorrhoids, divertic  . Fibroids   . History of migraine headaches   . Hypertension    White coat  . IBS (irritable bowel syndrome)   . Neck pain     Family History  Problem Relation Age of Onset  . Hypertension Father     Social History   Social History  . Marital status: Single    Spouse name: N/A  . Number of children: N/A  . Years of education: N/A   Occupational History  . Not on file.   Social History Main Topics  . Smoking status: Never Smoker  . Smokeless tobacco: Never Used  . Alcohol use No  . Drug use: No  . Sexual activity: Not on file   Other Topics Concern  . Not on file   Social History Narrative   Occasionally caffeine- nor more than 1 serv/ day    No Known Allergies   Constitutional: Denies headache, fatigue, fever or abrupt weight changes.  HEENT:  Positive ear fullness, sore throat. Denies eye redness, eye pain, pressure behind the eyes, facial pain, nasal congestion, ear pain, ringing in the ears, wax buildup, runny nose or bloody nose. Respiratory: Positive cough. Denies difficulty breathing or shortness of breath.  Cardiovascular: Denies chest pain, chest tightness, palpitations or swelling in the hands or feet.   No other specific complaints in a complete  review of systems (except as listed in HPI above).  Objective:   BP (!) 138/92   Pulse 90   Temp 98.2 F (36.8 C) (Oral)   Wt 176 lb (79.8 kg)   LMP 12/29/2010   SpO2 97%   BMI 37.10 kg/m  Wt Readings from Last 3 Encounters:  11/04/16 176 lb (79.8 kg)  10/21/16 174 lb 8 oz (79.2 kg)  07/14/16 174 lb 8 oz (79.2 kg)     General: Appears her stated age, in NAD. HEENT: Head: normal shape and size, no sinus tenderness noted; Eyes: sclera white, no icterus, conjunctiva pink; Ears: Tm's retracted but intact, normal light reflex; Nose: mucosa pink and moist, septum midline; Throat/Mouth: + PND. Teeth present, mucosa pink and moist, no exudate noted, no lesions or ulcerations noted.  Neck: No cervical lymphadenopathy.  Cardiovascular: Normal rate and rhythm. Pulmonary/Chest: Normal effort and positive vesicular breath sounds. No respiratory distress. No wheezes, rales or ronchi noted.       Assessment & Plan:   Viral Upper Respiratory Infection with Cough:  Get some rest and drink plenty of water Do salt water gargles for the sore throat No indication for repeat abx eRx for Pred Taperx 6 days Rx for Hycodan cough syrup  RTC as needed or if symptoms persist.   Webb Silversmith, NP

## 2016-11-04 NOTE — Patient Instructions (Signed)
Cough, Adult Introduction A cough helps to clear your throat and lungs. A cough may last only 2-3 weeks (acute), or it may last longer than 8 weeks (chronic). Many different things can cause a cough. A cough may be a sign of an illness or another medical condition. Follow these instructions at home:  Pay attention to any changes in your cough.  Take medicines only as told by your doctor.  If you were prescribed an antibiotic medicine, take it as told by your doctor. Do not stop taking it even if you start to feel better.  Talk with your doctor before you try using a cough medicine.  Drink enough fluid to keep your pee (urine) clear or pale yellow.  If the air is dry, use a cold steam vaporizer or humidifier in your home.  Stay away from things that make you cough at work or at home.  If your cough is worse at night, try using extra pillows to raise your head up higher while you sleep.  Do not smoke, and try not to be around smoke. If you need help quitting, ask your doctor.  Do not have caffeine.  Do not drink alcohol.  Rest as needed. Contact a doctor if:  You have new problems (symptoms).  You cough up yellow fluid (pus).  Your cough does not get better after 2-3 weeks, or your cough gets worse.  Medicine does not help your cough and you are not sleeping well.  You have pain that gets worse or pain that is not helped with medicine.  You have a fever.  You are losing weight and you do not know why.  You have night sweats. Get help right away if:  You cough up blood.  You have trouble breathing.  Your heartbeat is very fast. This information is not intended to replace advice given to you by your health care provider. Make sure you discuss any questions you have with your health care provider. Document Released: 05/29/2011 Document Revised: 02/21/2016 Document Reviewed: 11/22/2014  2017 Elsevier  

## 2016-11-19 ENCOUNTER — Ambulatory Visit: Payer: BLUE CROSS/BLUE SHIELD | Admitting: Family Medicine

## 2017-01-28 IMAGING — CT CT RENAL STONE PROTOCOL
2 of 6 series · 9 of 46 positions shown, 10 images · non-contrast
Comparison: None.

CLINICAL DATA: Right flank pain radiating to the right upper and
right lower quadrants. Trace hematuria.

EXAM:
CT ABDOMEN AND PELVIS WITHOUT CONTRAST
TECHNIQUE: Multidetector CT imaging of the abdomen and pelvis was performed
following the standard protocol without IV contrast.

[Series 201: stone study, idose (2) · axial · 0.92mm/px · z∈[-1044,-654]mm · 6 of 94 slices shown, 7 images]
[im 8/94  soft-tissue]
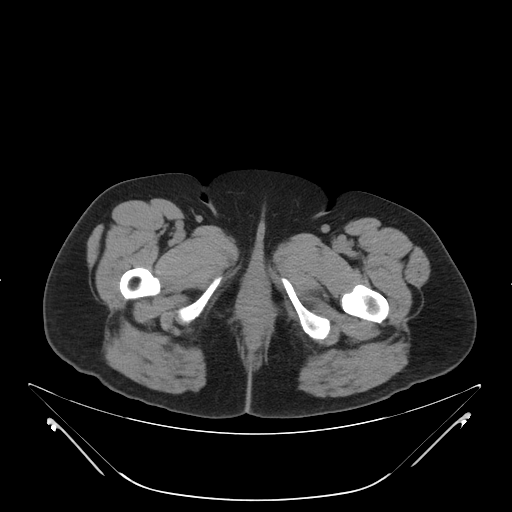
[im 8/94  bone]
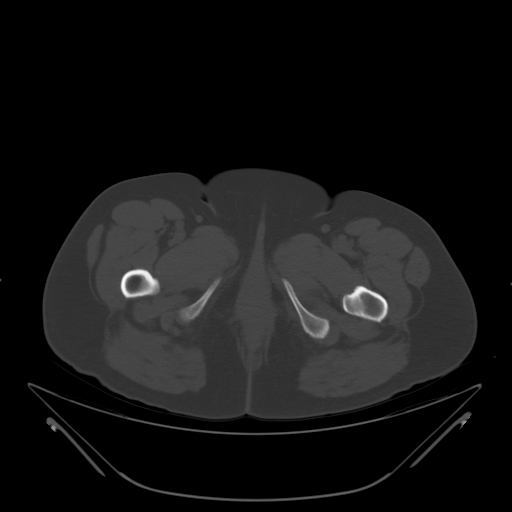
[im 24/94  soft-tissue]
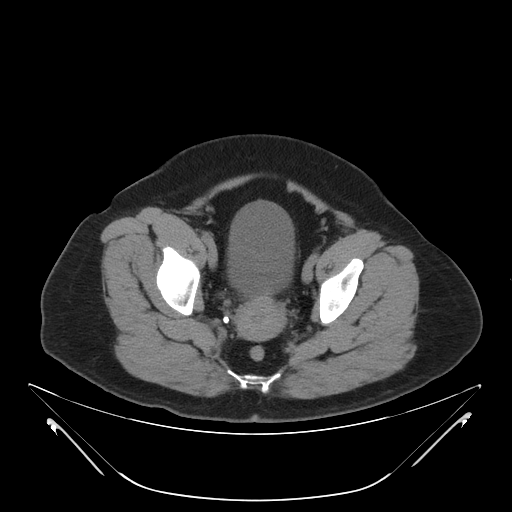
[im 39/94  soft-tissue]
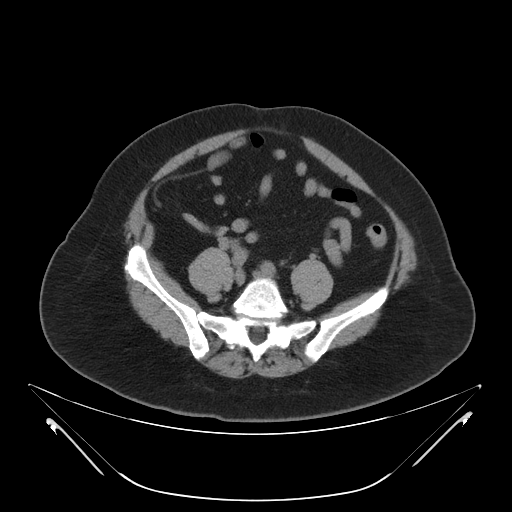
[im 55/94  soft-tissue]
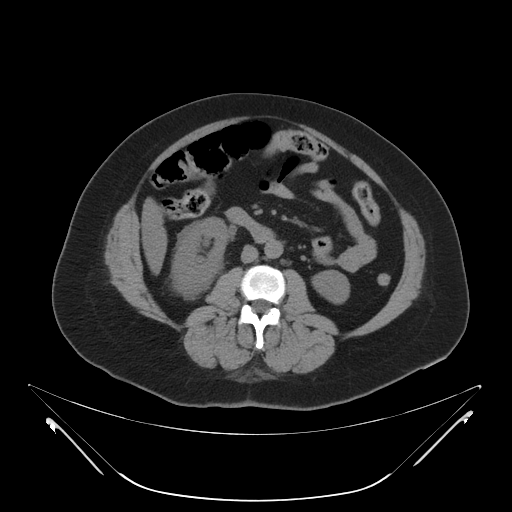
[im 70/94  soft-tissue]
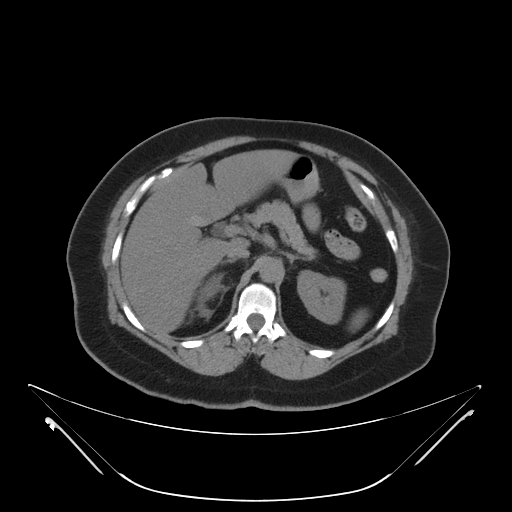
[im 86/94  soft-tissue]
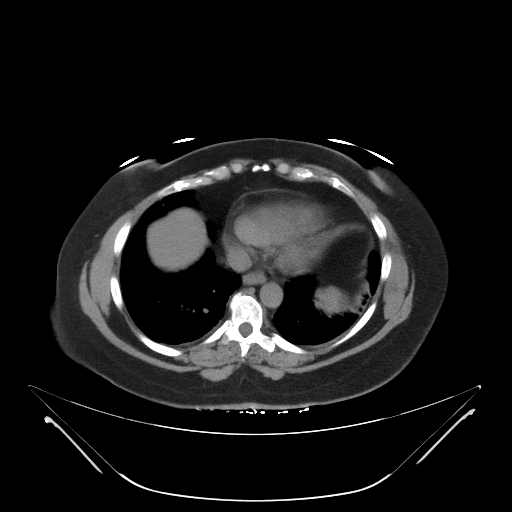

[Series 203: coronals, idose (2) · coronal · 0.45mm/px · 3 of 139 slices shown]
[im 35/139  soft-tissue]
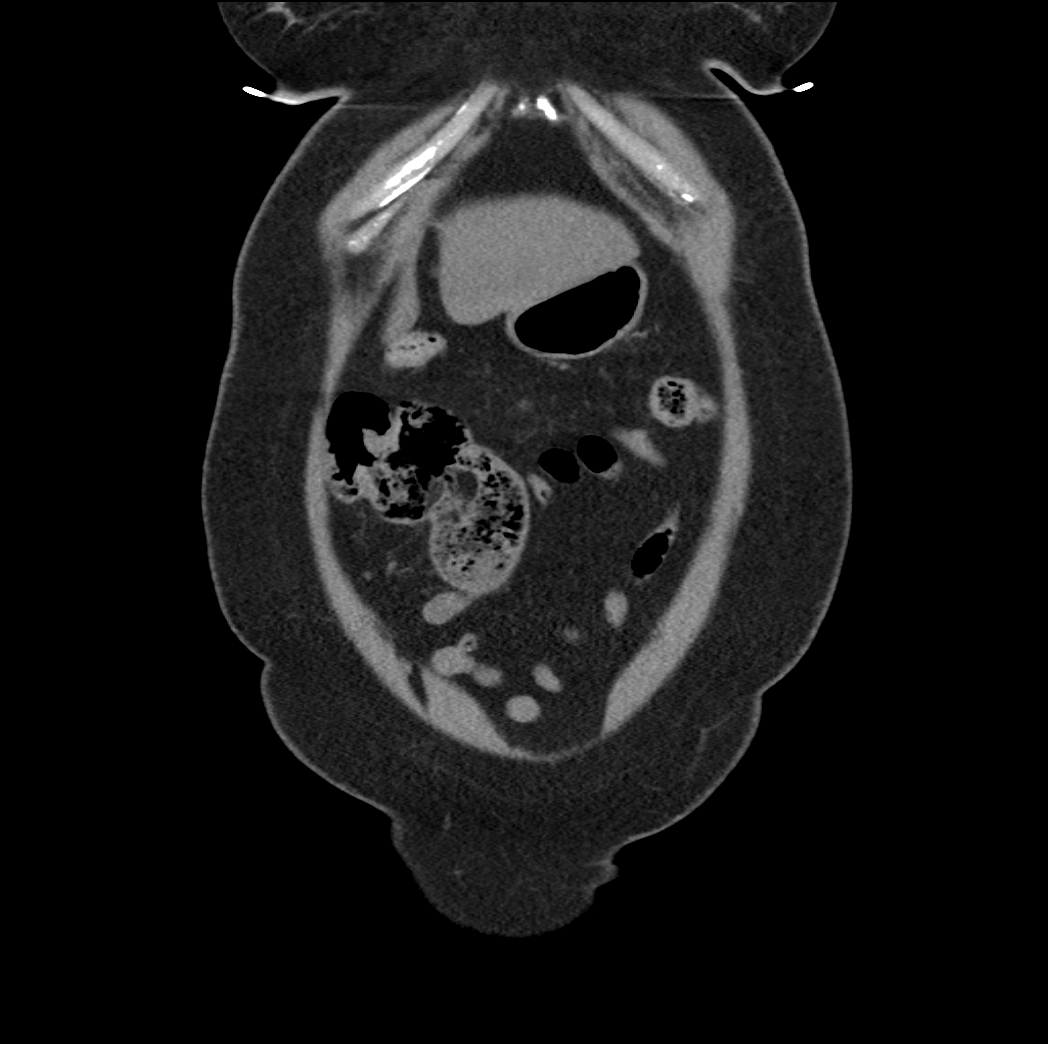
[im 70/139  soft-tissue]
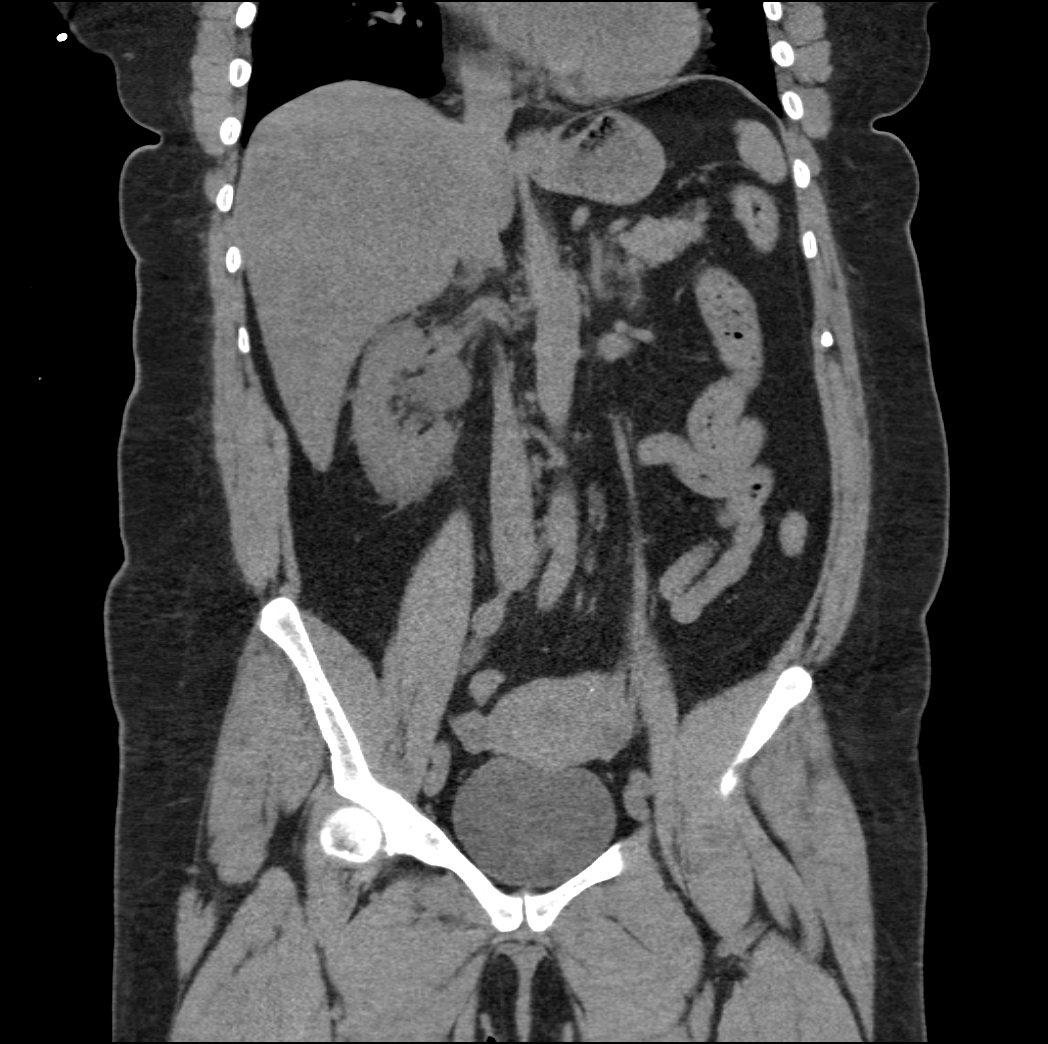
[im 104/139  soft-tissue]
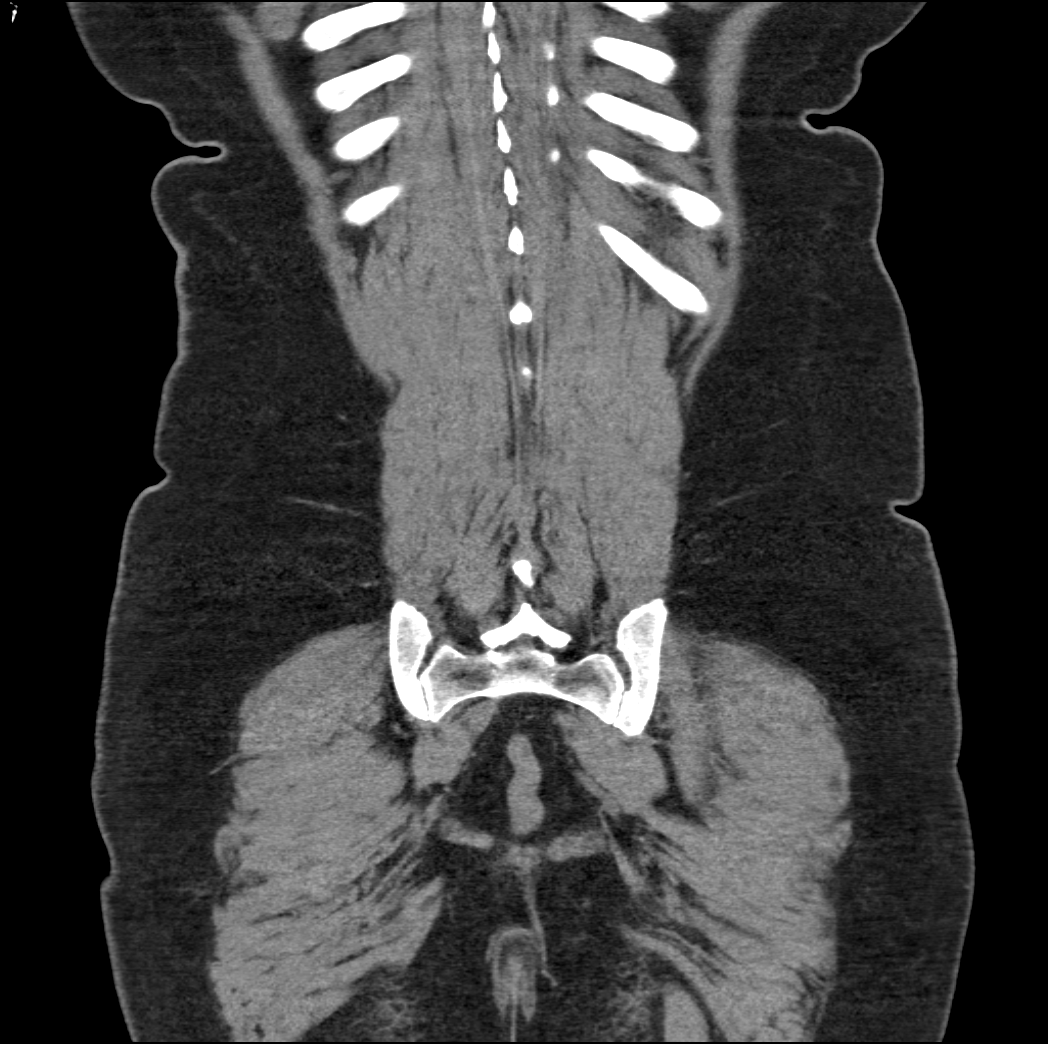

[9 of 46 positions shown; findings below may reference images not displayed]

FINDINGS: Lower chest:  Minimal dependent atelectasis. No pleural fluid.

Liver: Decreased density consistent with steatosis. Fatty sparing
adjacent to the gallbladder fossa. No evidence of focal lesion.

Hepatobiliary: Gallbladder physiologically distended, no calcified
stone. Question of intraluminal sludge. No biliary dilatation.

Pancreas: No ductal dilatation or inflammation.

Spleen: Normal.

Adrenal glands: No nodule.

Kidneys: Obstructing 2 mm stone at the right ureterovesicular
junction with moderate hydroureteronephrosis and perinephric
stranding. No left hydronephrosis. No additional nonobstructing
uropathy.

Stomach/Bowel: Stomach physiologically distended. There are no
dilated or thickened small bowel loops. Small volume of stool
throughout the colon without colonic wall thickening. Colonic
diverticulosis in the distal colon without diverticulitis. The
appendix is normal.

Vascular/Lymphatic: No retroperitoneal adenopathy. Abdominal aorta
is normal in caliber.

Reproductive: Uterus and adnexa are normal for age.

Bladder: Physiologically distended, no wall thickening.

Other: No free air, free fluid, or intra-abdominal fluid collection.
Tiny fat containing umbilical hernia.

Musculoskeletal: There are no acute or suspicious osseous
abnormalities. Degenerative change in the spine and both sacroiliac
joints.
IMPRESSION: 1. Obstructing 2 mm stone at the right ureterovesicular junction
with moderate hydronephrosis and perinephric stranding.
2. Incidental findings of hepatic steatosis and sigmoid colonic
diverticulosis.

## 2017-08-01 ENCOUNTER — Other Ambulatory Visit: Payer: Self-pay | Admitting: Family Medicine

## 2017-08-03 NOTE — Telephone Encounter (Signed)
Will refill electronically  

## 2017-08-03 NOTE — Telephone Encounter (Signed)
Last refill 07/14/10 #60 + 11rfl Last OV 11/04/16  Ok to refill?

## 2017-08-05 ENCOUNTER — Other Ambulatory Visit: Payer: Self-pay | Admitting: *Deleted

## 2017-08-05 MED ORDER — GABAPENTIN 300 MG PO CAPS: 600.0000 mg | ORAL_CAPSULE | Freq: Every day | ORAL | 0 refills | Status: DC

## 2017-09-01 ENCOUNTER — Other Ambulatory Visit: Payer: Self-pay | Admitting: Family Medicine

## 2017-09-02 NOTE — Telephone Encounter (Signed)
Dr. Glori Bickers hasn't seen pt in over a year and no future appts., please advise

## 2017-09-02 NOTE — Telephone Encounter (Signed)
Please schedule f/u and refill until then  

## 2017-09-03 NOTE — Telephone Encounter (Signed)
Left voicemail requesting pt to call the office back (CRM created)

## 2017-09-11 NOTE — Telephone Encounter (Signed)
Left VM requesting pt to call the office back 

## 2017-10-16 ENCOUNTER — Ambulatory Visit: Payer: BLUE CROSS/BLUE SHIELD | Admitting: Family Medicine

## 2017-10-16 ENCOUNTER — Encounter: Payer: Self-pay | Admitting: Family Medicine

## 2017-10-16 VITALS — BP 140/82 | HR 110 | Temp 98.9°F | Wt 178.5 lb

## 2017-10-16 DIAGNOSIS — J069 Acute upper respiratory infection, unspecified: Secondary | ICD-10-CM | POA: Diagnosis not present

## 2017-10-16 MED ORDER — HYDROCODONE-HOMATROPINE 5-1.5 MG/5ML PO SYRP: 5.0000 mL | ORAL_SOLUTION | Freq: Three times a day (TID) | ORAL | 0 refills | Status: DC | PRN

## 2017-10-16 MED ORDER — AMOXICILLIN 875 MG PO TABS: 875.0000 mg | ORAL_TABLET | Freq: Two times a day (BID) | ORAL | 0 refills | Status: DC

## 2017-10-16 NOTE — Progress Notes (Signed)
Subjective:    Patient ID: Jo Lamb, female    DOB: 04/14/1961, 57 y.o.   MRN: [id number]  HPI This is a 57 yo female who presents today with cough and nasal congestion x 6-7  days. Cough productive of multicolored sputum. Nasal drainage colored. Headache on left side. Teeth hurting on left side. Some left ear pain. Sore throat in beginning. No SOB, occasional wheeze, feels fatigued, no aches. No fever. Has taken mucinex sinus max, alleve, some relief. Able to sleep last night.     Past Medical History:  Diagnosis Date  . Back pain   . Carpal tunnel syndrome on both sides 02/2004// 10/22/2004   Nerve conduction study x's 2  . Colon polyps    colonoscopy- polyps, hemorrhoids, divertic  . Fibroids   . History of migraine headaches   . Hypertension    White coat  . IBS (irritable bowel syndrome)   . Neck pain    Past Surgical History:  Procedure Laterality Date  . MRI     MRI of head// Negative  . TUBAL LIGATION    . VAGINAL DELIVERY     x's two   Family History  Problem Relation Age of Onset  . Hypertension Father    Social History   Tobacco Use  . Smoking status: Never Smoker  . Smokeless tobacco: Never Used  Substance Use Topics  . Alcohol use: No    Alcohol/week: 0.0 oz  . Drug use: No      Review of Systems Per HPI    Objective:   Physical Exam  Constitutional: She is oriented to person, place, and time. She appears well-developed and well-nourished.  HENT:  Head: Normocephalic and atraumatic.  Right Ear: External ear normal.  Left Ear: External ear normal.  Nose: Nose normal.  Mouth/Throat: Oropharynx is clear and moist.  Eyes: Conjunctivae are normal.  Neck: Normal range of motion. Neck supple.  Cardiovascular: Normal rate, regular rhythm and normal heart sounds.  Pulmonary/Chest: Effort normal and breath sounds normal.  Frequent dry cough  Lymphadenopathy:    She has no cervical adenopathy.  Neurological: She is alert and oriented to  person, place, and time.  Skin: Skin is warm and dry.  Psychiatric: She has a normal mood and affect. Her behavior is normal. Judgment and thought content normal.  Vitals reviewed.     BP 140/82   Pulse (!) 110   Temp 98.9 F (37.2 C) (Oral)   Wt 178 lb 8 oz (81 kg)   LMP 12/29/2010   SpO2 99%   BMI 37.63 kg/m  Wt Readings from Last 3 Encounters:  10/16/17 178 lb 8 oz (81 kg)  11/04/16 176 lb (79.8 kg)  10/21/16 174 lb 8 oz (79.2 kg)       Assessment & Plan:  1. URI with cough and congestion - Provided written and verbal information regarding diagnosis and treatment. -  Patient Instructions  For nasal congestion you can use Afrin nasal spray for 3 days max, Sudafed, saline nasal spray (generic is fine for all). If not better in 3-4 days, can start antibiotic.  Drink enough fluids to make your urine light yellow. For fever/chill/muscle aches you can take over the counter acetaminophen or ibuprofen.  Please come back in if you are not better in 5-7 days or if you develop wheezing, shortness of breath or persistent vomiting.   - HYDROcodone-homatropine (HYCODAN) 5-1.5 MG/5ML syrup; Take 5 mLs by mouth every 8 (eight) hours as  needed for cough.  Dispense: 120 mL; Refill: 0   Clarene Reamer, FNP-BC  [facility]  10/16/2017 6:27 PM

## 2017-10-16 NOTE — Patient Instructions (Signed)
For nasal congestion you can use Afrin nasal spray for 3 days max, Sudafed, saline nasal spray (generic is fine for all). If not better in 3-4 days, can start antibiotic.  Drink enough fluids to make your urine light yellow. For fever/chill/muscle aches you can take over the counter acetaminophen or ibuprofen.  Please come back in if you are not better in 5-7 days or if you develop wheezing, shortness of breath or persistent vomiting.

## 2017-11-11 ENCOUNTER — Encounter: Payer: Self-pay | Admitting: Family Medicine

## 2017-11-11 ENCOUNTER — Ambulatory Visit: Payer: BLUE CROSS/BLUE SHIELD | Admitting: Family Medicine

## 2017-11-11 VITALS — BP 132/88 | HR 105 | Temp 98.0°F | Ht <= 58 in | Wt 174.5 lb

## 2017-11-11 DIAGNOSIS — E7800 Pure hypercholesterolemia, unspecified: Secondary | ICD-10-CM

## 2017-11-11 DIAGNOSIS — Z23 Encounter for immunization: Secondary | ICD-10-CM | POA: Diagnosis not present

## 2017-11-11 DIAGNOSIS — J069 Acute upper respiratory infection, unspecified: Secondary | ICD-10-CM | POA: Diagnosis not present

## 2017-11-11 DIAGNOSIS — I10 Essential (primary) hypertension: Secondary | ICD-10-CM

## 2017-11-11 DIAGNOSIS — Z6836 Body mass index (BMI) 36.0-36.9, adult: Secondary | ICD-10-CM

## 2017-11-11 DIAGNOSIS — R739 Hyperglycemia, unspecified: Secondary | ICD-10-CM | POA: Diagnosis not present

## 2017-11-11 DIAGNOSIS — B9789 Other viral agents as the cause of diseases classified elsewhere: Secondary | ICD-10-CM | POA: Diagnosis not present

## 2017-11-11 LAB — LIPID PANEL
Cholesterol: 226 mg/dL — ABNORMAL HIGH (ref 0–200)
HDL: 40.5 mg/dL (ref >39.00)
LDL Cholesterol: 154 mg/dL — ABNORMAL HIGH (ref 0–99)
NonHDL: 185.64
Total CHOL/HDL Ratio: 6
Triglycerides: 156 mg/dL — ABNORMAL HIGH (ref 0.0–149.0)
VLDL: 31.2 mg/dL (ref 0.0–40.0)

## 2017-11-11 LAB — CBC WITH DIFFERENTIAL/PLATELET
Basophils Absolute: 0 10*3/uL (ref 0.0–0.1)
Basophils Relative: 0.5 % (ref 0.0–3.0)
EOS PCT: 0.2 % (ref 0.0–5.0)
Eosinophils Absolute: 0 10*3/uL (ref 0.0–0.7)
HCT: 43.8 % (ref 36.0–46.0)
Hemoglobin: 14.4 g/dL (ref 12.0–15.0)
LYMPHS ABS: 2.4 10*3/uL (ref 0.7–4.0)
Lymphocytes Relative: 39.1 % (ref 12.0–46.0)
MCHC: 32.9 g/dL (ref 30.0–36.0)
MCV: 90.8 fl (ref 78.0–100.0)
MONOS PCT: 7.7 % (ref 3.0–12.0)
Monocytes Absolute: 0.5 10*3/uL (ref 0.1–1.0)
NEUTROS ABS: 3.3 10*3/uL (ref 1.4–7.7)
NEUTROS PCT: 52.5 % (ref 43.0–77.0)
PLATELETS: 213 10*3/uL (ref 150.0–400.0)
RBC: 4.82 Mil/uL (ref 3.87–5.11)
RDW: 13.6 % (ref 11.5–15.5)
WBC: 6.2 10*3/uL (ref 4.0–10.5)

## 2017-11-11 LAB — COMPREHENSIVE METABOLIC PANEL
ALK PHOS: 95 U/L (ref 39–117)
ALT: 37 U/L — AB (ref 0–35)
AST: 31 U/L (ref 0–37)
Albumin: 4.5 g/dL (ref 3.5–5.2)
BILIRUBIN TOTAL: 0.5 mg/dL (ref 0.2–1.2)
BUN: 12 mg/dL (ref 6–23)
CO2: 29 meq/L (ref 19–32)
Calcium: 9.7 mg/dL (ref 8.4–10.5)
Chloride: 104 mEq/L (ref 96–112)
Creatinine, Ser: 1.05 mg/dL (ref 0.40–1.20)
GFR: 57.33 mL/min — AB (ref >60.00)
Glucose, Bld: 107 mg/dL — ABNORMAL HIGH (ref 70–99)
Potassium: 4.4 mEq/L (ref 3.5–5.1)
Sodium: 140 mEq/L (ref 135–145)
Total Protein: 7.6 g/dL (ref 6.0–8.3)

## 2017-11-11 LAB — TSH: TSH: 2.62 u[IU]/mL (ref 0.35–4.50)

## 2017-11-11 LAB — HEMOGLOBIN A1C: Hgb A1c MFr Bld: 6.4 % (ref 4.6–6.5)

## 2017-11-11 MED ORDER — LISINOPRIL-HYDROCHLOROTHIAZIDE 10-12.5 MG PO TABS: 1.0000 | ORAL_TABLET | Freq: Every day | ORAL | 11 refills | Status: DC

## 2017-11-11 MED ORDER — IMIPRAMINE HCL 25 MG PO TABS: 50.0000 mg | ORAL_TABLET | Freq: Every day | ORAL | 11 refills | Status: DC

## 2017-11-11 NOTE — Progress Notes (Signed)
Subjective:    Patient ID: Jo Lamb, female    DOB: 06/07/1961, 57 y.o.   MRN: [id number]  HPI Here for f/u of chronic health problems   Wt Readings from Last 3 Encounters:  11/11/17 174 lb 8 oz (79.2 kg)  10/16/17 178 lb 8 oz (81 kg)  11/04/16 176 lb (79.8 kg)  thinks she lost and then gained some in between ?  36.79 kg/m   Very little exercise- no time to add it in   Was seen a month ago for uri  Given hycodan and rec supp care Still coughing some from that - gradually getting better  occ productive   She has some pain under her R rib (from coughing?) Twisting /turning may make it worse   Was sick on superbowl Sunday- vomited several times  That got better   bp is is up today on first check -recently missed days of lisinopril/hct   bp is not high at home  No cp or palpitations or headaches or edema  No side effects to medicines  BP Readings from Last 3 Encounters:  11/11/17 (!) 148/94  10/16/17 140/82  11/04/16 (!) 138/92  BP: 132/88 -much improved on 2nd check  Pulse is 105  Does not drink enough fluids   She has not had gabapentin  - may not need it   Lab Results  Component Value Date   CREATININE 1.10 07/14/2016   BUN 11 07/14/2016   NA 139 07/14/2016   K 4.1 07/14/2016   CL 100 07/14/2016   CO2 31 07/14/2016   Lab Results  Component Value Date   ALT 84 (H) 07/14/2016   AST 55 (H) 07/14/2016   ALKPHOS 82 07/14/2016   BILITOT 0.6 07/14/2016   Hyperglycemia Lab Results  Component Value Date   HGBA1C 6.3 07/14/2016  biggest problem is sweets /pasta and breads  Hyperlipidemia Lab Results  Component Value Date   CHOL 235 (H) 07/14/2016   HDL 47.00 07/14/2016   LDLCALC 158 (H) 07/14/2016   LDLDIRECT 192.0 06/27/2015   TRIG 150.0 (H) 07/14/2016   CHOLHDL 5 07/14/2016  she stopped statin in the past due to fear of it  Had elevated lfts in the past  Not watching diet   Patient Active Problem List   Diagnosis Date Noted  . Routine  general medical examination at a health care facility 05/01/2014  . Hyperglycemia 11/14/2012  . Hand dermatitis 03/31/2011  . BACK PAIN WITH RADICULOPATHY 11/29/2008  . Essential hypertension 11/01/2008  . Obesity 08/11/2008  . GERD 08/11/2008  . Hyperlipidemia 07/07/2008  . NECK PAIN, CHRONIC 07/21/2007  . MIGRAINE HEADACHE 07/20/2007  . IBS 07/20/2007   Past Medical History:  Diagnosis Date  . Back pain   . Carpal tunnel syndrome on both sides 02/2004// 10/22/2004   Nerve conduction study x's 2  . Colon polyps    colonoscopy- polyps, hemorrhoids, divertic  . Fibroids   . History of migraine headaches   . Hypertension    White coat  . IBS (irritable bowel syndrome)   . Neck pain    Past Surgical History:  Procedure Laterality Date  . MRI     MRI of head// Negative  . TUBAL LIGATION    . VAGINAL DELIVERY     x's two   Social History   Tobacco Use  . Smoking status: Never Smoker  . Smokeless tobacco: Never Used  Substance Use Topics  . Alcohol use: No    Alcohol/week:  0.0 oz  . Drug use: No   Family History  Problem Relation Age of Onset  . Hypertension Father    No Known Allergies No current outpatient medications on file prior to visit.   No current facility-administered medications on file prior to visit.     Review of Systems  Constitutional: Negative for activity change, appetite change, fatigue, fever and unexpected weight change.  HENT: Negative for congestion, ear pain, rhinorrhea, sinus pressure and sore throat.   Eyes: Negative for pain, redness and visual disturbance.  Respiratory: Positive for cough. Negative for shortness of breath and wheezing.        Pos for R rib pain /chest wall pain   Cardiovascular: Negative for chest pain and palpitations.  Gastrointestinal: Negative for abdominal pain, blood in stool, constipation and diarrhea.  Endocrine: Negative for polydipsia and polyuria.  Genitourinary: Negative for dysuria, frequency and  urgency.  Musculoskeletal: Negative for arthralgias, back pain and myalgias.  Skin: Negative for pallor and rash.  Allergic/Immunologic: Negative for environmental allergies.  Neurological: Negative for dizziness, syncope and headaches.  Hematological: Negative for adenopathy. Does not bruise/bleed easily.  Psychiatric/Behavioral: Negative for decreased concentration and dysphoric mood. The patient is not nervous/anxious.        Pos for busy lifestyle with no time for self care        Objective:   Physical Exam  Constitutional: She appears well-developed and well-nourished. No distress.  obese and well appearing   HENT:  Head: Normocephalic and atraumatic.  Right Ear: External ear normal.  Left Ear: External ear normal.  Nose: Nose normal.  Mouth/Throat: Oropharynx is clear and moist.  Nares are boggy with clear rhinorrhea and pnd (mild) No sinus tenderness  Eyes: Conjunctivae and EOM are normal. Pupils are equal, round, and reactive to light. Right eye exhibits no discharge. Left eye exhibits no discharge. No scleral icterus.  Neck: Normal range of motion. Neck supple. No JVD present. Carotid bruit is not present. No thyromegaly present.  Cardiovascular: Normal rate, regular rhythm, normal heart sounds and intact distal pulses. Exam reveals no gallop.  Pulmonary/Chest: Effort normal and breath sounds normal. No respiratory distress. She has no wheezes. She has no rales. She exhibits tenderness.  Good air exch No rales or rhonchi  Tender R anterior ribs w/o crepitus or rash  Abdominal: Soft. Bowel sounds are normal. She exhibits no distension and no mass. There is no tenderness.  Musculoskeletal: She exhibits no edema or tenderness.  Lymphadenopathy:    She has no cervical adenopathy.  Neurological: She is alert. She has normal reflexes. No cranial nerve deficit. She exhibits normal muscle tone. Coordination normal.  Skin: Skin is warm and dry. No rash noted. No erythema. No  pallor.     Psychiatric: She has a normal mood and affect.          Assessment & Plan:   Problem List Items Addressed This Visit      Cardiovascular and Mediastinum   Essential hypertension - Primary    Better on 2nd check  bp in fair control at this time  BP Readings from Last 1 Encounters:  11/11/17 132/88   No changes needed Disc lifstyle change with low sodium diet and exercise   Disc setting phone reminders to take medication and prev non compliance Disc lifestyle change F/u 2-3 mo  Lab today       Relevant Medications   lisinopril-hydrochlorothiazide (PRINZIDE,ZESTORETIC) 10-12.5 MG tablet   Other Relevant Orders   Comprehensive metabolic  panel (Completed)   Lipid panel (Completed)   CBC with Differential/Platelet (Completed)   TSH (Completed)     Respiratory   Viral URI with cough    This is gradually getting better with persistent cough causing R rib pain  Will watch closely  Low threshold to check cxr if needed         Other   Hyperglycemia    A1C today  Long disc re diet/exercise disc imp of low glycemic diet and wt loss to prevent DM2  Also water intake  Foot and eye care  F/u planned      Relevant Orders   Hemoglobin A1c (Completed)   Hyperlipidemia    Rev last lipid Not on statin-afraid of it and her last lfts were elevated  Lab today  Long disc re: low sat/trans fat diet  F/u planned       Relevant Medications   lisinopril-hydrochlorothiazide (PRINZIDE,ZESTORETIC) 10-12.5 MG tablet   Other Relevant Orders   Lipid panel (Completed)   Obesity    .Discussed how this problem influences overall health and the risks it imposes  Reviewed plan for weight loss with lower calorie diet (via better food choices and also portion control or program like weight watchers) and exercise building up to or more than 30 minutes 5 days per week including some aerobic activity   Pt has need for much lifestyle change Will start with inc water intake    Then low glycemic and trans fat diet  F/u planned       Other Visit Diagnoses    Need for influenza vaccination       Relevant Orders   Flu Vaccine QUAD 6+ mos PF IM (Fluarix Quad PF) (Completed)

## 2017-11-11 NOTE — Patient Instructions (Addendum)
Try to get most of your carbohydrates from produce (with the exception of white potatoes)  Eat less bread/pasta/rice/snack foods/cereals/sweets and other items from the middle of the grocery store (processed carbs)   For cholesterol   Avoid red meat/ fried foods/ egg yolks/ fatty breakfast meats/ butter, cheese and high fat dairy/ and shellfish    You need 64 oz of fluids per day (mostly water)   Get on track with taking blood pressure medicine   Flu shot today   Start setting reminders on your phone for medicines / drinking water etc

## 2017-11-12 DIAGNOSIS — J069 Acute upper respiratory infection, unspecified: Secondary | ICD-10-CM | POA: Insufficient documentation

## 2017-11-12 DIAGNOSIS — B9789 Other viral agents as the cause of diseases classified elsewhere: Secondary | ICD-10-CM

## 2017-11-12 NOTE — Assessment & Plan Note (Signed)
.  Discussed how this problem influences overall health and the risks it imposes  Reviewed plan for weight loss with lower calorie diet (via better food choices and also portion control or program like weight watchers) and exercise building up to or more than 30 minutes 5 days per week including some aerobic activity   Pt has need for much lifestyle change Will start with inc water intake  Then low glycemic and trans fat diet  F/u planned

## 2017-11-12 NOTE — Assessment & Plan Note (Signed)
Better on 2nd check  bp in fair control at this time  BP Readings from Last 1 Encounters:  11/11/17 132/88   No changes needed Disc lifstyle change with low sodium diet and exercise   Disc setting phone reminders to take medication and prev non compliance Disc lifestyle change F/u 2-3 mo  Lab today

## 2017-11-12 NOTE — Assessment & Plan Note (Signed)
A1C today  Long disc re diet/exercise disc imp of low glycemic diet and wt loss to prevent DM2  Also water intake  Foot and eye care  F/u planned

## 2017-11-12 NOTE — Assessment & Plan Note (Signed)
Rev last lipid Not on statin-afraid of it and her last lfts were elevated  Lab today  Long disc re: low sat/trans fat diet  F/u planned

## 2017-11-12 NOTE — Assessment & Plan Note (Signed)
This is gradually getting better with persistent cough causing R rib pain  Will watch closely  Low threshold to check cxr if needed

## 2018-01-26 ENCOUNTER — Ambulatory Visit: Payer: BLUE CROSS/BLUE SHIELD | Admitting: Family Medicine

## 2018-12-11 ENCOUNTER — Other Ambulatory Visit: Payer: Self-pay | Admitting: Family Medicine

## 2018-12-13 NOTE — Telephone Encounter (Signed)
Patient called about refill.  She's concerned about coming in to the office due to the Coronavirus.  Patient scheduled a follow up appointment in May.  Can her medication be refilled until her appointment?  Patient said she's.also, almost out of her Lisinopril.  She thinks it will run out before her May appointment. Patient has one more day left on the Imprimine.

## 2018-12-14 MED ORDER — LISINOPRIL-HYDROCHLOROTHIAZIDE 10-12.5 MG PO TABS: 1.0000 | ORAL_TABLET | Freq: Every day | ORAL | 2 refills | Status: DC

## 2018-12-14 NOTE — Addendum Note (Signed)
Addended by: Tammi Sou on: 12/14/2018 10:45 AM   Modules accepted: Orders

## 2018-12-14 NOTE — Telephone Encounter (Addendum)
Meds refilled.

## 2019-02-16 ENCOUNTER — Ambulatory Visit (INDEPENDENT_AMBULATORY_CARE_PROVIDER_SITE_OTHER): Payer: Self-pay | Admitting: Family Medicine

## 2019-02-16 ENCOUNTER — Encounter: Payer: Self-pay | Admitting: Family Medicine

## 2019-02-16 VITALS — Wt 159.0 lb

## 2019-02-16 DIAGNOSIS — I10 Essential (primary) hypertension: Secondary | ICD-10-CM

## 2019-02-16 DIAGNOSIS — R7303 Prediabetes: Secondary | ICD-10-CM

## 2019-02-16 DIAGNOSIS — R7401 Elevation of levels of liver transaminase levels: Secondary | ICD-10-CM

## 2019-02-16 DIAGNOSIS — R740 Nonspecific elevation of levels of transaminase and lactic acid dehydrogenase [LDH]: Secondary | ICD-10-CM

## 2019-02-16 DIAGNOSIS — G43909 Migraine, unspecified, not intractable, without status migrainosus: Secondary | ICD-10-CM

## 2019-02-16 DIAGNOSIS — Z6835 Body mass index (BMI) 35.0-35.9, adult: Secondary | ICD-10-CM

## 2019-02-16 DIAGNOSIS — E7800 Pure hypercholesterolemia, unspecified: Secondary | ICD-10-CM

## 2019-02-16 MED ORDER — LISINOPRIL-HYDROCHLOROTHIAZIDE 10-12.5 MG PO TABS: 1.0000 | ORAL_TABLET | Freq: Every day | ORAL | 11 refills | Status: DC

## 2019-02-16 MED ORDER — IMIPRAMINE HCL 25 MG PO TABS: ORAL_TABLET | ORAL | 11 refills | Status: DC

## 2019-02-16 NOTE — Assessment & Plan Note (Signed)
Continue the imipramine 50 mg  Working well  Also good health habits

## 2019-02-16 NOTE — Progress Notes (Signed)
Virtual Visit via Video Note  I connected with Jo Lamb on 02/16/19 at  9:00 AM EDT by a video enabled telemedicine application and verified that I am speaking with the correct person using two identifiers.  Location: Patient: home Provider: office    I discussed the limitations of evaluation and management by telemedicine and the availability of in person appointments. The patient expressed understanding and agreed to proceed.  History of Present Illness: Pt presents for f/u of chronic medical problems   Wt Readings from Last 3 Encounters:  11/11/17 174 lb 8 oz (79.2 kg)  10/16/17 178 lb 8 oz (81 kg)  11/04/16 176 lb (79.8 kg)   Has lost 15 lb! -intentional  Eating better - has stopped sugar  Avoiding fast food  Husband doing it with her- lost 15 lb also  Drinking more water  Doing yard work  Some walking   husb treated for prostate cancer   Not working - does not have to get out  Unsure when she will go back    HTN No cp or palpitations or headaches or edema  No side effects to medicines  BP Readings from Last 3 Encounters:  11/11/17 132/88  10/16/17 140/82  11/04/16 (!) 138/92    Pt takes lisinopril hct  Last night 152/94 -thinks she was a little antsy however Has missed some medication doses -some in the last week  She forgets to take it    Lab Results  Component Value Date   CREATININE 1.05 11/11/2017   BUN 12 11/11/2017   NA 140 11/11/2017   K 4.4 11/11/2017   CL 104 11/11/2017   CO2 29 11/11/2017   Due for labs   H/o migraines  Pt takes imipramine 25 mg daily - prevents  Has not had one for quite a while Getting enough sleep  Stress level is ok /improved   H/o prediabetes Lab Results  Component Value Date   HGBA1C 6.4 11/11/2017   Cholesterol-hyperlipidemia  Lab Results  Component Value Date   CHOL 226 (H) 11/11/2017   HDL 40.50 11/11/2017   LDLCALC 154 (H) 11/11/2017   LDLDIRECT 192.0 06/27/2015   TRIG 156.0 (H) 11/11/2017    CHOLHDL 6 11/11/2017  not on medication  H/o elevated LFT Lab Results  Component Value Date   ALT 37 (H) 11/11/2017   AST 31 11/11/2017   ALKPHOS 95 11/11/2017   BILITOT 0.5 11/11/2017   (this was improved)  Pt declines mammograms  Declines hep C or HIV testing  Cannot afford colon cancer screening at this time   Mood has been very good!   Review of Systems  Constitutional: Positive for weight loss. Negative for chills, fever and malaise/fatigue.  HENT: Negative for sore throat.   Eyes: Negative for blurred vision.  Respiratory: Negative for cough and shortness of breath.   Cardiovascular: Negative for chest pain, palpitations and leg swelling.  Gastrointestinal: Negative for heartburn and nausea.  Musculoskeletal: Negative for myalgias.  Skin: Negative for rash.  Neurological: Negative for dizziness and headaches.       Headaches are very well controlled  Psychiatric/Behavioral: Negative for depression. The patient is not nervous/anxious.     Patient Active Problem List   Diagnosis Date Noted  . Elevated transaminase level 02/16/2019  . Routine general medical examination at a health care facility 05/01/2014  . Prediabetes 11/14/2012  . Hand dermatitis 03/31/2011  . BACK PAIN WITH RADICULOPATHY 11/29/2008  . Essential hypertension 11/01/2008  . Obesity 08/11/2008  .  GERD 08/11/2008  . Hyperlipidemia 07/07/2008  . NECK PAIN, CHRONIC 07/21/2007  . Migraine headache 07/20/2007  . IBS 07/20/2007   Past Medical History:  Diagnosis Date  . Back pain   . Carpal tunnel syndrome on both sides 02/2004// 10/22/2004   Nerve conduction study x's 2  . Colon polyps    colonoscopy- polyps, hemorrhoids, divertic  . Fibroids   . History of migraine headaches   . Hypertension    White coat  . IBS (irritable bowel syndrome)   . Neck pain    Past Surgical History:  Procedure Laterality Date  . MRI     MRI of head// Negative  . TUBAL LIGATION    . VAGINAL DELIVERY      x's two   Social History   Tobacco Use  . Smoking status: Never Smoker  . Smokeless tobacco: Never Used  Substance Use Topics  . Alcohol use: No    Alcohol/week: 0.0 standard drinks  . Drug use: No   Family History  Problem Relation Age of Onset  . Hypertension Father    No Known Allergies No current outpatient medications on file prior to visit.   No current facility-administered medications on file prior to visit.     Observations/Objective: Patient appears well, in no distress Weight is improved /wt loss noted  No facial swelling or asymmetry Normal voice-not hoarse and no slurred speech No obvious tremor or mobility impairment Moving neck and UEs normally Able to hear the call well  No cough or shortness of breath during interview  Talkative and mentally sharp with no cognitive changes No skin changes on face or neck , no rash or pallor Affect is normal /cheerful and talkative    Assessment and Plan: Problem List Items Addressed This Visit      Cardiovascular and Mediastinum   Migraine headache    Continue the imipramine 50 mg  Working well  Also good health habits      Relevant Medications   imipramine (TOFRANIL) 25 MG tablet   lisinopril-hydrochlorothiazide (ZESTORETIC) 10-12.5 MG tablet   Essential hypertension - Primary    Not always compliant with medications  Enc her to use pill box and set cell phone reminder to take medication  inst to check bp when relaxed at home in the next 2 weeks and call us with a result       Relevant Medications   lisinopril-hydrochlorothiazide (ZESTORETIC) 10-12.5 MG tablet   Other Relevant Orders   CBC with Differential/Platelet   Comprehensive metabolic panel   Lipid panel   TSH     Other   Hyperlipidemia    Disc goals for lipids and reasons to control them Rev last labs with pt Rev low sat fat diet in detail Due for labs this year-set that up  She is eating better/hope for improvement       Relevant  Medications   lisinopril-hydrochlorothiazide (ZESTORETIC) 10-12.5 MG tablet   Other Relevant Orders   Lipid panel   Obesity    Pt has lost 15 lb  Enc to keep up the good work with better diet/exercise         Prediabetes    Due for A1C Expect improvement with better diet/exercise and wt loss  disc imp of low glycemic diet and wt loss to prevent DM2  Labs planned       Relevant Orders   Hemoglobin A1c   Elevated transaminase level    Labs planned With weight loss expect further  improvement       Relevant Orders   Comprehensive metabolic panel       Follow Up Instructions: Please check BP at home when you are relaxed and not missed medication in the next 2 weeks and call us with result Set reminder on phone and use day of the week pill box to remind you to take your medication  Try to get most of your carbohydrates from produce (with the exception of white potatoes)  Eat less bread/pasta/rice/snack foods/cereals/sweets and other items from the middle of the grocery store (processed carbs) Avoid red meat/ fried foods/ egg yolks/ fatty breakfast meats/ butter, cheese and high fat dairy/ and shellfish    Keep working on healthy diet/exercise  Great job so far!  The office will call you to set up a drive through lab visit    I discussed the assessment and treatment plan with the patient. The patient was provided an opportunity to ask questions and all were answered. The patient agreed with the plan and demonstrated an understanding of the instructions.   The patient was advised to call back or seek an in-person evaluation if the symptoms worsen or if the condition fails to improve as anticipated.     Loura Pardon, MD

## 2019-02-16 NOTE — Assessment & Plan Note (Signed)
Disc goals for lipids and reasons to control them Rev last labs with pt Rev low sat fat diet in detail Due for labs this year-set that up  She is eating better/hope for improvement

## 2019-02-16 NOTE — Patient Instructions (Signed)
Please check BP at home when you are relaxed and not missed medication in the next 2 weeks and call us with result Set reminder on phone and use day of the week pill box to remind you to take your medication  Try to get most of your carbohydrates from produce (with the exception of white potatoes)  Eat less bread/pasta/rice/snack foods/cereals/sweets and other items from the middle of the grocery store (processed carbs) Avoid red meat/ fried foods/ egg yolks/ fatty breakfast meats/ butter, cheese and high fat dairy/ and shellfish    Keep working on healthy diet/exercise  Great job so far!  The office will call you to set up a drive through lab visit

## 2019-02-16 NOTE — Assessment & Plan Note (Signed)
Due for A1C Expect improvement with better diet/exercise and wt loss  disc imp of low glycemic diet and wt loss to prevent DM2  Labs planned

## 2019-02-16 NOTE — Assessment & Plan Note (Signed)
Pt has lost 15 lb  Enc to keep up the good work with better diet/exercise

## 2019-02-16 NOTE — Assessment & Plan Note (Signed)
Labs planned With weight loss expect further improvement

## 2019-02-16 NOTE — Assessment & Plan Note (Signed)
Not always compliant with medications  Enc her to use pill box and set cell phone reminder to take medication  inst to check bp when relaxed at home in the next 2 weeks and call us with a result

## 2019-02-18 ENCOUNTER — Telehealth: Payer: Self-pay | Admitting: Family Medicine

## 2019-02-18 NOTE — Telephone Encounter (Signed)
I left a message on patient's voice mail to return my call.  Patient needs to schedule a fasting lab.

## 2019-02-25 ENCOUNTER — Other Ambulatory Visit: Payer: Self-pay

## 2019-02-25 ENCOUNTER — Other Ambulatory Visit (INDEPENDENT_AMBULATORY_CARE_PROVIDER_SITE_OTHER): Payer: Self-pay

## 2019-02-25 DIAGNOSIS — R740 Nonspecific elevation of levels of transaminase and lactic acid dehydrogenase [LDH]: Secondary | ICD-10-CM

## 2019-02-25 DIAGNOSIS — E7800 Pure hypercholesterolemia, unspecified: Secondary | ICD-10-CM

## 2019-02-25 DIAGNOSIS — R7303 Prediabetes: Secondary | ICD-10-CM

## 2019-02-25 DIAGNOSIS — R7401 Elevation of levels of liver transaminase levels: Secondary | ICD-10-CM

## 2019-02-25 DIAGNOSIS — I10 Essential (primary) hypertension: Secondary | ICD-10-CM

## 2019-02-25 LAB — COMPREHENSIVE METABOLIC PANEL
ALT: 20 U/L (ref 0–35)
AST: 19 U/L (ref 0–37)
Albumin: 4.3 g/dL (ref 3.5–5.2)
Alkaline Phosphatase: 78 U/L (ref 39–117)
BUN: 18 mg/dL (ref 6–23)
CO2: 31 mEq/L (ref 19–32)
Calcium: 9.8 mg/dL (ref 8.4–10.5)
Chloride: 99 mEq/L (ref 96–112)
Creatinine, Ser: 1.05 mg/dL (ref 0.40–1.20)
GFR: 53.7 mL/min — ABNORMAL LOW (ref >60.00)
Glucose, Bld: 112 mg/dL — ABNORMAL HIGH (ref 70–99)
Potassium: 4.3 mEq/L (ref 3.5–5.1)
Sodium: 137 mEq/L (ref 135–145)
Total Bilirubin: 0.6 mg/dL (ref 0.2–1.2)
Total Protein: 7.3 g/dL (ref 6.0–8.3)

## 2019-02-25 LAB — LIPID PANEL
Cholesterol: 229 mg/dL — ABNORMAL HIGH (ref 0–200)
HDL: 39.3 mg/dL (ref >39.00)
LDL Cholesterol: 155 mg/dL — ABNORMAL HIGH (ref 0–99)
NonHDL: 189.97
Total CHOL/HDL Ratio: 6
Triglycerides: 176 mg/dL — ABNORMAL HIGH (ref 0.0–149.0)
VLDL: 35.2 mg/dL (ref 0.0–40.0)

## 2019-02-25 LAB — CBC WITH DIFFERENTIAL/PLATELET
Basophils Absolute: 0 10*3/uL (ref 0.0–0.1)
Basophils Relative: 0.3 % (ref 0.0–3.0)
Eosinophils Absolute: 0 10*3/uL (ref 0.0–0.7)
Eosinophils Relative: 0.4 % (ref 0.0–5.0)
HCT: 42.8 % (ref 36.0–46.0)
Hemoglobin: 14.7 g/dL (ref 12.0–15.0)
Lymphocytes Relative: 38 % (ref 12.0–46.0)
Lymphs Abs: 2.9 10*3/uL (ref 0.7–4.0)
MCHC: 34.3 g/dL (ref 30.0–36.0)
MCV: 90.2 fl (ref 78.0–100.0)
Monocytes Absolute: 0.6 10*3/uL (ref 0.1–1.0)
Monocytes Relative: 8.1 % (ref 3.0–12.0)
Neutro Abs: 4.1 10*3/uL (ref 1.4–7.7)
Neutrophils Relative %: 53.2 % (ref 43.0–77.0)
Platelets: 202 10*3/uL (ref 150.0–400.0)
RBC: 4.74 Mil/uL (ref 3.87–5.11)
RDW: 13.2 % (ref 11.5–15.5)
WBC: 7.7 10*3/uL (ref 4.0–10.5)

## 2019-02-25 LAB — TSH: TSH: 4.24 u[IU]/mL (ref 0.35–4.50)

## 2019-02-25 LAB — HEMOGLOBIN A1C: Hgb A1c MFr Bld: 6.2 % (ref 4.6–6.5)

## 2020-03-06 ENCOUNTER — Other Ambulatory Visit: Payer: Self-pay | Admitting: Family Medicine

## 2020-03-06 NOTE — Telephone Encounter (Signed)
Pt hasn't been seen in over a year and no future appts., please advise  

## 2020-03-06 NOTE — Telephone Encounter (Signed)
Med refilled once and Carrie will reach out to try and get CPE scheduled  °

## 2020-03-06 NOTE — Telephone Encounter (Signed)
Please schedule f/u or PE and refill until then 

## 2020-03-07 NOTE — Telephone Encounter (Signed)
I left a message on patient's voice mail to return my call. When patient returns call, please schedule cpx or f/u appointment.

## 2020-03-31 ENCOUNTER — Other Ambulatory Visit: Payer: Self-pay | Admitting: Family Medicine

## 2020-04-25 ENCOUNTER — Other Ambulatory Visit: Payer: Self-pay | Admitting: Family Medicine

## 2020-04-25 NOTE — Telephone Encounter (Signed)
Pt hasn't been seen in over a year and no future appts., please advise  

## 2020-04-25 NOTE — Telephone Encounter (Signed)
Please schedule PE or f/u and refill until then 

## 2020-04-26 ENCOUNTER — Other Ambulatory Visit: Payer: Self-pay | Admitting: Family Medicine

## 2020-04-26 NOTE — Telephone Encounter (Signed)
I left a message on patient's voice mail to return my call.  If patient returns call, please schedule cpx or f/u.

## 2020-04-26 NOTE — Telephone Encounter (Signed)
Med refilled once and Carrie will reach out to pt to try and get appt scheduled  

## 2020-04-26 NOTE — Telephone Encounter (Signed)
Med filled once and Morey [facility] has already left VM requesting pt to call and schedule appt

## 2020-05-23 ENCOUNTER — Other Ambulatory Visit: Payer: Self-pay | Admitting: Family Medicine

## 2020-05-24 ENCOUNTER — Other Ambulatory Visit: Payer: Self-pay | Admitting: Family Medicine

## 2020-07-02 ENCOUNTER — Ambulatory Visit (INDEPENDENT_AMBULATORY_CARE_PROVIDER_SITE_OTHER): Payer: 59 | Admitting: Family Medicine

## 2020-07-02 ENCOUNTER — Other Ambulatory Visit: Payer: Self-pay

## 2020-07-02 ENCOUNTER — Encounter: Payer: Self-pay | Admitting: Family Medicine

## 2020-07-02 VITALS — BP 136/72 | HR 95 | Temp 97.5°F | Ht <= 58 in | Wt 175.4 lb

## 2020-07-02 DIAGNOSIS — G43909 Migraine, unspecified, not intractable, without status migrainosus: Secondary | ICD-10-CM | POA: Diagnosis not present

## 2020-07-02 DIAGNOSIS — I10 Essential (primary) hypertension: Secondary | ICD-10-CM | POA: Diagnosis not present

## 2020-07-02 DIAGNOSIS — E7800 Pure hypercholesterolemia, unspecified: Secondary | ICD-10-CM

## 2020-07-02 DIAGNOSIS — Z6835 Body mass index (BMI) 35.0-35.9, adult: Secondary | ICD-10-CM

## 2020-07-02 LAB — CBC WITH DIFFERENTIAL/PLATELET
Basophils Absolute: 0 10*3/uL (ref 0.0–0.1)
Basophils Relative: 0.5 % (ref 0.0–3.0)
Eosinophils Absolute: 0.1 10*3/uL (ref 0.0–0.7)
Eosinophils Relative: 1.5 % (ref 0.0–5.0)
HCT: 40.1 % (ref 36.0–46.0)
Hemoglobin: 13.4 g/dL (ref 12.0–15.0)
Lymphocytes Relative: 35.2 % (ref 12.0–46.0)
Lymphs Abs: 2.6 10*3/uL (ref 0.7–4.0)
MCHC: 33.5 g/dL (ref 30.0–36.0)
MCV: 90.7 fl (ref 78.0–100.0)
Monocytes Absolute: 0.6 10*3/uL (ref 0.1–1.0)
Monocytes Relative: 7.6 % (ref 3.0–12.0)
Neutro Abs: 4.1 10*3/uL (ref 1.4–7.7)
Neutrophils Relative %: 55.2 % (ref 43.0–77.0)
Platelets: 218 10*3/uL (ref 150.0–400.0)
RBC: 4.42 Mil/uL (ref 3.87–5.11)
RDW: 13.4 % (ref 11.5–15.5)
WBC: 7.4 10*3/uL (ref 4.0–10.5)

## 2020-07-02 LAB — COMPREHENSIVE METABOLIC PANEL
ALT: 59 U/L — ABNORMAL HIGH (ref 0–35)
AST: 43 U/L — ABNORMAL HIGH (ref 0–37)
Albumin: 4.6 g/dL (ref 3.5–5.2)
Alkaline Phosphatase: 76 U/L (ref 39–117)
BUN: 13 mg/dL (ref 6–23)
CO2: 30 mEq/L (ref 19–32)
Calcium: 9.8 mg/dL (ref 8.4–10.5)
Chloride: 101 mEq/L (ref 96–112)
Creatinine, Ser: 1.02 mg/dL (ref 0.40–1.20)
GFR: 55.27 mL/min — ABNORMAL LOW (ref >60.00)
Glucose, Bld: 102 mg/dL — ABNORMAL HIGH (ref 70–99)
Potassium: 4.4 mEq/L (ref 3.5–5.1)
Sodium: 140 mEq/L (ref 135–145)
Total Bilirubin: 0.5 mg/dL (ref 0.2–1.2)
Total Protein: 7.1 g/dL (ref 6.0–8.3)

## 2020-07-02 LAB — LIPID PANEL
Cholesterol: 236 mg/dL — ABNORMAL HIGH (ref 0–200)
HDL: 39.4 mg/dL (ref >39.00)
LDL Cholesterol: 157 mg/dL — ABNORMAL HIGH (ref 0–99)
NonHDL: 196.97
Total CHOL/HDL Ratio: 6
Triglycerides: 198 mg/dL — ABNORMAL HIGH (ref 0.0–149.0)
VLDL: 39.6 mg/dL (ref 0.0–40.0)

## 2020-07-02 LAB — TSH: TSH: 3.42 u[IU]/mL (ref 0.35–4.50)

## 2020-07-02 MED ORDER — IMIPRAMINE HCL 25 MG PO TABS: 50.0000 mg | ORAL_TABLET | Freq: Every day | ORAL | 3 refills | Status: DC

## 2020-07-02 MED ORDER — LISINOPRIL-HYDROCHLOROTHIAZIDE 10-12.5 MG PO TABS: 1.0000 | ORAL_TABLET | Freq: Every day | ORAL | 3 refills | Status: DC

## 2020-07-02 NOTE — Assessment & Plan Note (Signed)
bp in fair control at this time  BP Readings from Last 1 Encounters:  07/02/20 136/72   No changes needed-continues lisinopril hct  Most recent labs reviewed  Disc lifstyle change with low sodium diet and exercise  Labs drawn today

## 2020-07-02 NOTE — Patient Instructions (Addendum)
Get back to working on diet/exercise   Try to get most of your carbohydrates from produce (with the exception of white potatoes)  Eat less bread/pasta/rice/snack foods/cereals/sweets and other items from the middle of the grocery store (processed carbs)  For cholesterol Avoid red meat/ fried foods/ egg yolks/ fatty breakfast meats/ butter, cheese and high fat dairy/ and shellfish    Labs today   Think about a schedule change for a little extra exercise   Labs today   COVID-19 Vaccine Information can be found at: ShippingScam.co.uk For questions related to vaccine distribution or appointments, please email [email or web address] [email or web address] or call [phone].

## 2020-07-02 NOTE — Progress Notes (Signed)
Subjective:    Patient ID: Jo Lamb, female    DOB: 1961-03-29, 59 y.o.   MRN: [id number]  This visit occurred during the SARS-CoV-2 public health emergency.  Safety protocols were in place, including screening questions prior to the visit, additional usage of staff PPE, and extensive cleaning of exam room while observing appropriate contact time as indicated for disinfecting solutions.    HPI Pt presents for f/u of chronic medical problems   Wt Readings from Last 3 Encounters:  07/02/20 175 lb 6 oz (79.5 kg)  02/16/19 159 lb (72.1 kg)  11/11/17 174 lb 8 oz (79.2 kg)   36.65 kg/m  Has not had covid or vaccine  She is scared to get it but considering    Taking dultera vitamins  Making effort to be safe    HTN bp is stable today  No cp or palpitations or headaches or edema  No side effects to medicines  BP Readings from Last 3 Encounters:  07/02/20 136/72  11/11/17 132/88  10/16/17 140/82     Taking lisinopril hct 10-12.5 mg daily    Pulse Readings from Last 3 Encounters:  07/02/20 95  11/11/17 (!) 105  10/16/17 (!) 110    H/o migraines - was doing well until running out of medication  Taking imipramine and needs refills  Migraines are always L sided  No severe ones     H/o hyperlipidemia Lab Results  Component Value Date   CHOL 229 (H) 02/25/2019   HDL 39.30 02/25/2019   LDLCALC 155 (H) 02/25/2019   LDLDIRECT 192.0 06/27/2015   TRIG 176.0 (H) 02/25/2019   CHOLHDL 6 02/25/2019   Also elevated transaminase Lab Results  Component Value Date   ALT 20 02/25/2019   AST 19 02/25/2019   ALKPHOS 78 02/25/2019   BILITOT 0.6 02/25/2019   good at last check   Declines flu shot   Not eating well  Has gained wt  Getting ready to get back on track   Patient Active Problem List   Diagnosis Date Noted  . Elevated transaminase level 02/16/2019  . Routine general medical examination at a health care facility 05/01/2014  . Prediabetes 11/14/2012    . Hand dermatitis 03/31/2011  . BACK PAIN WITH RADICULOPATHY 11/29/2008  . Essential hypertension 11/01/2008  . Obesity 08/11/2008  . GERD 08/11/2008  . Hyperlipidemia 07/07/2008  . NECK PAIN, CHRONIC 07/21/2007  . Migraine headache 07/20/2007  . IBS 07/20/2007   Past Medical History:  Diagnosis Date  . Back pain   . Carpal tunnel syndrome on both sides 02/2004// 10/22/2004   Nerve conduction study x's 2  . Colon polyps    colonoscopy- polyps, hemorrhoids, divertic  . Fibroids   . History of migraine headaches   . Hypertension    White coat  . IBS (irritable bowel syndrome)   . Neck pain    Past Surgical History:  Procedure Laterality Date  . MRI     MRI of head// Negative  . TUBAL LIGATION    . VAGINAL DELIVERY     x's two   Social History   Tobacco Use  . Smoking status: Never Smoker  . Smokeless tobacco: Never Used  Substance Use Topics  . Alcohol use: No    Alcohol/week: 0.0 standard drinks  . Drug use: No   Family History  Problem Relation Age of Onset  . Hypertension Father    No Known Allergies No current outpatient medications on file prior to visit.  No current facility-administered medications on file prior to visit.     Review of Systems  Constitutional: Negative for activity change, appetite change, fatigue, fever and unexpected weight change.  HENT: Negative for congestion, ear pain, rhinorrhea, sinus pressure and sore throat.   Eyes: Negative for pain, redness and visual disturbance.  Respiratory: Negative for cough, shortness of breath and wheezing.   Cardiovascular: Negative for chest pain and palpitations.  Gastrointestinal: Negative for abdominal pain, blood in stool, constipation and diarrhea.  Endocrine: Negative for polydipsia and polyuria.  Genitourinary: Negative for dysuria, frequency and urgency.  Musculoskeletal: Negative for arthralgias, back pain and myalgias.  Skin: Negative for pallor and rash.  Allergic/Immunologic:  Negative for environmental allergies.  Neurological: Negative for dizziness, syncope and headaches.  Hematological: Negative for adenopathy. Does not bruise/bleed easily.  Psychiatric/Behavioral: Negative for decreased concentration and dysphoric mood. The patient is not nervous/anxious.        Has had stress Not motivated for self care until now  Some emotional eating       Objective:   Physical Exam Constitutional:      General: She is not in acute distress.    Appearance: Normal appearance. She is well-developed. She is obese. She is not ill-appearing or diaphoretic.  HENT:     Head: Normocephalic and atraumatic.  Eyes:     General: No scleral icterus.    Conjunctiva/sclera: Conjunctivae normal.     Pupils: Pupils are equal, round, and reactive to light.  Neck:     Thyroid: No thyromegaly.     Vascular: No carotid bruit or JVD.  Cardiovascular:     Rate and Rhythm: Regular rhythm. Tachycardia present.     Pulses: Normal pulses.     Heart sounds: Normal heart sounds. No gallop.   Pulmonary:     Effort: Pulmonary effort is normal. No respiratory distress.     Breath sounds: Normal breath sounds. No wheezing or rales.  Abdominal:     General: Bowel sounds are normal. There is no distension or abdominal bruit.     Palpations: Abdomen is soft. There is no mass.     Tenderness: There is no abdominal tenderness.  Musculoskeletal:     Cervical back: Normal range of motion and neck supple. No tenderness.     Right lower leg: No edema.     Left lower leg: No edema.  Lymphadenopathy:     Cervical: No cervical adenopathy.  Skin:    General: Skin is warm and dry.     Coloration: Skin is not pale.     Findings: No erythema or rash.     Comments: Solar lentigines diffusely   Neurological:     Mental Status: She is alert.     Cranial Nerves: No cranial nerve deficit.     Sensory: No sensory deficit.     Motor: No weakness.     Coordination: Coordination normal.     Gait: Gait  normal.     Deep Tendon Reflexes: Reflexes are normal and symmetric. Reflexes normal.  Psychiatric:        Mood and Affect: Mood normal.        Cognition and Memory: Cognition and memory normal.           Assessment & Plan:   Problem List Items Addressed This Visit      Cardiovascular and Mediastinum   Migraine headache    Does well when taking imipramine for headache prev  No side effects Discussed lifestyle change  for headache prev incl diet/exercise/ caffeine avoidance      Relevant Medications   imipramine (TOFRANIL) 25 MG tablet   lisinopril-hydrochlorothiazide (ZESTORETIC) 10-12.5 MG tablet   Essential hypertension - Primary    bp in fair control at this time  BP Readings from Last 1 Encounters:  07/02/20 136/72   No changes needed-continues lisinopril hct  Most recent labs reviewed  Disc lifstyle change with low sodium diet and exercise  Labs drawn today      Relevant Medications   lisinopril-hydrochlorothiazide (ZESTORETIC) 10-12.5 MG tablet   Other Relevant Orders   CBC with Differential/Platelet (Completed)   Comprehensive metabolic panel (Completed)   Lipid panel (Completed)   TSH (Completed)     Other   Hyperlipidemia    Disc goals for lipids and reasons to control them Rev last labs with pt Rev low sat fat diet in detail Due for labs  Diet control (per pt diet is not optimal)       Relevant Medications   lisinopril-hydrochlorothiazide (ZESTORETIC) 10-12.5 MG tablet   Other Relevant Orders   Lipid panel (Completed)   Class 2 severe obesity due to excess calories with serious comorbidity and body mass index (BMI) of 35.0 to 35.9 in adult [facility])    Discussed how this problem influences overall health and the risks it imposes  Reviewed plan for weight loss with lower calorie diet (via better food choices and also portion control or program like weight watchers) and exercise building up to or more than 30 minutes 5 days per week including some  aerobic activity   She is getting ready to make some changes

## 2020-07-02 NOTE — Assessment & Plan Note (Signed)
Does well when taking imipramine for headache prev  No side effects Discussed lifestyle change for headache prev incl diet/exercise/ caffeine avoidance

## 2020-07-02 NOTE — Assessment & Plan Note (Signed)
Discussed how this problem influences overall health and the risks it imposes  Reviewed plan for weight loss with lower calorie diet (via better food choices and also portion control or program like weight watchers) and exercise building up to or more than 30 minutes 5 days per week including some aerobic activity   She is getting ready to make some changes

## 2020-07-02 NOTE — Assessment & Plan Note (Signed)
Disc goals for lipids and reasons to control them Rev last labs with pt Rev low sat fat diet in detail Due for labs  Diet control (per pt diet is not optimal)

## 2020-07-03 ENCOUNTER — Telehealth: Payer: Self-pay | Admitting: *Deleted

## 2020-07-03 NOTE — Telephone Encounter (Signed)
Left VM requesting pt to call the office back 

## 2020-07-03 NOTE — Telephone Encounter (Signed)
-----   Message from Abner Greenspan, MD sent at 07/02/2020  9:12 PM EDT ----- Liver tests are elevated mildly-suspect due to diet/wt gain  Cholesterol too high -not much change from last time Avoid red meat/ fried foods/ egg yolks/ fatty breakfast meats/ butter, cheese and high fat dairy/ and shellfish   I recommend re checking lipid/liver in 3 mo after working on diet and exercise  If not improved may want to consider chol medication

## 2020-10-01 ENCOUNTER — Telehealth: Payer: Self-pay | Admitting: Family Medicine

## 2020-10-01 DIAGNOSIS — R7401 Elevation of levels of liver transaminase levels: Secondary | ICD-10-CM

## 2020-10-01 DIAGNOSIS — E7800 Pure hypercholesterolemia, unspecified: Secondary | ICD-10-CM

## 2020-10-01 NOTE — Telephone Encounter (Signed)
-----   Message from Alvina Chou sent at 09/17/2020  3:10 PM EST ----- Regarding: Lab orders for Thursday, 1.6.22 Lab orders, thanks

## 2020-10-04 ENCOUNTER — Other Ambulatory Visit: Payer: 59

## 2021-07-06 ENCOUNTER — Other Ambulatory Visit: Payer: Self-pay | Admitting: Family Medicine

## 2021-07-08 NOTE — Telephone Encounter (Signed)
Called pt and lvmcb

## 2021-07-08 NOTE — Telephone Encounter (Signed)
Pt hasn't been seen in over a year. Please schedule a f/u appt or a CPE (pt's choice) and then route back to me to refill med

## 2021-07-09 ENCOUNTER — Encounter: Payer: Self-pay | Admitting: Family Medicine

## 2021-07-09 NOTE — Telephone Encounter (Signed)
Pt called back and scheduled appt. Med refilled once

## 2021-07-09 NOTE — Telephone Encounter (Signed)
Sent letter

## 2021-07-30 ENCOUNTER — Encounter: Payer: Self-pay | Admitting: Family Medicine

## 2021-07-30 ENCOUNTER — Other Ambulatory Visit: Payer: Self-pay

## 2021-07-30 ENCOUNTER — Ambulatory Visit: Payer: 59 | Admitting: Family Medicine

## 2021-07-30 VITALS — BP 133/80 | HR 89 | Temp 98.1°F | Ht <= 58 in | Wt 173.2 lb

## 2021-07-30 DIAGNOSIS — R7303 Prediabetes: Secondary | ICD-10-CM

## 2021-07-30 DIAGNOSIS — Z23 Encounter for immunization: Secondary | ICD-10-CM | POA: Diagnosis not present

## 2021-07-30 DIAGNOSIS — E7800 Pure hypercholesterolemia, unspecified: Secondary | ICD-10-CM

## 2021-07-30 DIAGNOSIS — I10 Essential (primary) hypertension: Secondary | ICD-10-CM | POA: Diagnosis not present

## 2021-07-30 DIAGNOSIS — Z6836 Body mass index (BMI) 36.0-36.9, adult: Secondary | ICD-10-CM

## 2021-07-30 DIAGNOSIS — G43909 Migraine, unspecified, not intractable, without status migrainosus: Secondary | ICD-10-CM | POA: Diagnosis not present

## 2021-07-30 MED ORDER — LISINOPRIL-HYDROCHLOROTHIAZIDE 10-12.5 MG PO TABS: 1.0000 | ORAL_TABLET | Freq: Every day | ORAL | 3 refills | Status: DC

## 2021-07-30 MED ORDER — IMIPRAMINE HCL 25 MG PO TABS: 50.0000 mg | ORAL_TABLET | Freq: Every day | ORAL | 3 refills | Status: DC

## 2021-07-30 NOTE — Assessment & Plan Note (Signed)
Discussed how this problem influences overall health and the risks it imposes  Reviewed plan for weight loss with lower calorie diet (via better food choices and also portion control or program like weight watchers) and exercise building up to or more than 30 minutes 5 days per week including some aerobic activity   Pt is struggling with compliance with diet Discussed low glycemic options  Encouraged her to go back to the gym

## 2021-07-30 NOTE — Assessment & Plan Note (Signed)
Disc goals for lipids and reasons to control them Rev last labs with pt Rev low sat fat diet in detail Labs ordered   Diet is not optimal Pt finds it difficult to comply

## 2021-07-30 NOTE — Patient Instructions (Addendum)
Avoid red meat/ fried foods/ egg yolks/ fatty breakfast meats/ butter, cheese and high fat dairy/ and shellfish for cholesterol control   Flu shot today   Take care of yourself  Keep working on water intake   Try to get most of your carbohydrates from produce (with the exception of white potatoes)  Eat less bread/pasta/rice/snack foods/cereals/sweets and other items from the middle of the grocery store (processed carbs)

## 2021-07-30 NOTE — Progress Notes (Signed)
Subjective:    Patient ID: Jo Lamb, female    DOB: Apr 05, 1961, 59 y.o.   MRN: [id number]  This visit occurred during the SARS-CoV-2 public health emergency.  Safety protocols were in place, including screening questions prior to the visit, additional usage of staff PPE, and extensive cleaning of exam room while observing appropriate contact time as indicated for disinfecting solutions.   HPI Pt presents for f/u of HTN and cholesterol  Wt Readings from Last 3 Encounters:  07/30/21 173 lb 4 oz (78.6 kg)  07/02/20 175 lb 6 oz (79.5 kg)  02/16/19 159 lb (72.1 kg)   36.21 kg/m  Doing well  She lost 4 lbs -would like to loose more  Trying to take care of herself   Joined a gym - has taken a break trying to sell her mother's house Looking forward to going back   HTN bp is stable today  No cp or palpitations or headaches or edema  No side effects to medicines  BP Readings from Last 3 Encounters:  07/30/21 (!) 142/82  07/02/20 136/72  11/11/17 132/88     Pulse Readings from Last 3 Encounters:  07/30/21 89  07/02/20 95  11/11/17 (!) 105   Takes lisinopril hct 10-12.5 mg daily  At home her bp is lower 100s/70s usually    She is better about taking her bp med every day   Headaches are not bad   Lab Results  Component Value Date   CREATININE 1.02 07/02/2020   BUN 13 07/02/2020   NA 140 07/02/2020   K 4.4 07/02/2020   CL 101 07/02/2020   CO2 30 07/02/2020     Hyperlipidemia Lab Results  Component Value Date   CHOL 236 (H) 07/02/2020   HDL 39.40 07/02/2020   LDLCALC 157 (H) 07/02/2020   LDLDIRECT 192.0 06/27/2015   TRIG 198.0 (H) 07/02/2020   CHOLHDL 6 07/02/2020   Prediabetes Lab Results  Component Value Date   HGBA1C 6.2 02/25/2019   Diet has not been very good  Loves sweets and carbs  She started a dulterra product to help with sugar/carb cravings (has started helping)  Also collagen   Is able to replace sweets with fruit  Loves bread and  pasta   Eats chicken  Occ burger  Berniece Salines is a problem  Some fish   Not great with water intake    Had covid in jan -was bad   Patient Active Problem List   Diagnosis Date Noted   Elevated transaminase level 02/16/2019   Routine general medical examination at a health care facility 05/01/2014   Prediabetes 11/14/2012   Hand dermatitis 03/31/2011   BACK PAIN WITH RADICULOPATHY 11/29/2008   Essential hypertension 11/01/2008   Class 2 severe obesity due to excess calories with serious comorbidity and body mass index (BMI) of 36.0 to 36.9 in adult ([place]) 08/11/2008   GERD 08/11/2008   Hyperlipidemia 07/07/2008   NECK PAIN, CHRONIC 07/21/2007   Migraine headache 07/20/2007   IBS 07/20/2007   Past Medical History:  Diagnosis Date   Back pain    Carpal tunnel syndrome on both sides 02/2004// 10/22/2004   Nerve conduction study x's 2   Colon polyps    colonoscopy- polyps, hemorrhoids, divertic   Fibroids    History of migraine headaches    Hypertension    White coat   IBS (irritable bowel syndrome)    Neck pain    Past Surgical History:  Procedure Laterality Date  MRI     MRI of head// Negative   TUBAL LIGATION     VAGINAL DELIVERY     x's two   Social History   Tobacco Use   Smoking status: Never   Smokeless tobacco: Never  Substance Use Topics   Alcohol use: No    Alcohol/week: 0.0 standard drinks   Drug use: No   Family History  Problem Relation Age of Onset   Hypertension Father    No Known Allergies No current outpatient medications on file prior to visit.   No current facility-administered medications on file prior to visit.    Review of Systems  Constitutional:  Negative for activity change, appetite change, fatigue, fever and unexpected weight change.  HENT:  Negative for congestion, ear pain, rhinorrhea, sinus pressure and sore throat.   Eyes:  Negative for pain, redness and visual disturbance.  Respiratory:  Negative for cough, shortness of  breath and wheezing.   Cardiovascular:  Negative for chest pain and palpitations.  Gastrointestinal:  Negative for abdominal pain, blood in stool, constipation and diarrhea.  Endocrine: Negative for polydipsia and polyuria.  Genitourinary:  Negative for dysuria, frequency and urgency.  Musculoskeletal:  Negative for arthralgias, back pain and myalgias.  Skin:  Negative for pallor and rash.  Allergic/Immunologic: Negative for environmental allergies.  Neurological:  Negative for dizziness, syncope and headaches.  Hematological:  Negative for adenopathy. Does not bruise/bleed easily.  Psychiatric/Behavioral:  Negative for decreased concentration and dysphoric mood. The patient is not nervous/anxious.       Objective:   Physical Exam Constitutional:      General: She is not in acute distress.    Appearance: Normal appearance. She is well-developed. She is obese. She is not ill-appearing or diaphoretic.  HENT:     Head: Normocephalic and atraumatic.  Eyes:     Conjunctiva/sclera: Conjunctivae normal.     Pupils: Pupils are equal, round, and reactive to light.  Neck:     Thyroid: No thyromegaly.     Vascular: No carotid bruit or JVD.  Cardiovascular:     Rate and Rhythm: Normal rate and regular rhythm.     Heart sounds: Normal heart sounds.    No gallop.  Pulmonary:     Effort: Pulmonary effort is normal. No respiratory distress.     Breath sounds: Normal breath sounds. No wheezing or rales.  Abdominal:     General: Bowel sounds are normal. There is no distension or abdominal bruit.     Palpations: Abdomen is soft. There is no mass.     Tenderness: There is no abdominal tenderness.  Musculoskeletal:     Cervical back: Normal range of motion and neck supple.     Right lower leg: No edema.     Left lower leg: No edema.  Lymphadenopathy:     Cervical: No cervical adenopathy.  Skin:    General: Skin is warm and dry.     Coloration: Skin is not pale.     Findings: No rash.   Neurological:     Mental Status: She is alert.     Coordination: Coordination normal.     Deep Tendon Reflexes: Reflexes are normal and symmetric. Reflexes normal.  Psychiatric:        Mood and Affect: Mood normal.          Assessment & Plan:   Problem List Items Addressed This Visit       Cardiovascular and Mediastinum   Migraine headache  Improving over time with good bp control and imipramine        Relevant Medications   imipramine (TOFRANIL) 25 MG tablet   lisinopril-hydrochlorothiazide (ZESTORETIC) 10-12.5 MG tablet   Essential hypertension - Primary    bp in fair control at this time  BP Readings from Last 1 Encounters:  07/30/21 133/80  No changes needed Most recent labs reviewed  Disc lifstyle change with low sodium diet and exercise  Plan to continue lisinopril hct 10-12.5 mg daily  Labs ordered        Relevant Medications   lisinopril-hydrochlorothiazide (ZESTORETIC) 10-12.5 MG tablet   Other Relevant Orders   CBC with Differential/Platelet   Comprehensive metabolic panel   Lipid panel   TSH     Other   Hyperlipidemia    Disc goals for lipids and reasons to control them Rev last labs with pt Rev low sat fat diet in detail Labs ordered   Diet is not optimal Pt finds it difficult to comply      Relevant Medications   lisinopril-hydrochlorothiazide (ZESTORETIC) 10-12.5 MG tablet   Other Relevant Orders   Lipid panel   Class 2 severe obesity due to excess calories with serious comorbidity and body mass index (BMI) of 36.0 to 36.9 in adult [facility])    Discussed how this problem influences overall health and the risks it imposes  Reviewed plan for weight loss with lower calorie diet (via better food choices and also portion control or program like weight watchers) and exercise building up to or more than 30 minutes 5 days per week including some aerobic activity   Pt is struggling with compliance with diet Discussed low glycemic options   Encouraged her to go back to the gym      Prediabetes    A1C ordered  disc imp of low glycemic diet and wt loss to prevent DM2       Relevant Orders   Hemoglobin A1c   Other Visit Diagnoses     Need for influenza vaccination       Relevant Orders   Flu Vaccine QUAD 6+ mos PF IM (Fluarix Quad PF) (Completed)

## 2021-07-30 NOTE — Assessment & Plan Note (Signed)
Improving over time with good bp control and imipramine

## 2021-07-30 NOTE — Assessment & Plan Note (Signed)
bp in fair control at this time  BP Readings from Last 1 Encounters:  07/30/21 133/80   No changes needed Most recent labs reviewed  Disc lifstyle change with low sodium diet and exercise  Plan to continue lisinopril hct 10-12.5 mg daily  Labs ordered

## 2021-07-30 NOTE — Assessment & Plan Note (Signed)
A1C ordered °disc imp of low glycemic diet and wt loss to prevent DM2  °

## 2021-07-30 NOTE — Progress Notes (Signed)
I reviewed health advisor's note, was available for consultation, and agree with documentation and plan.  

## 2021-07-31 LAB — CBC WITH DIFFERENTIAL/PLATELET
Basophils Absolute: 0 10*3/uL (ref 0.0–0.1)
Basophils Relative: 0.6 % (ref 0.0–3.0)
Eosinophils Absolute: 0.1 10*3/uL (ref 0.0–0.7)
Eosinophils Relative: 1.3 % (ref 0.0–5.0)
HCT: 40.7 % (ref 36.0–46.0)
Hemoglobin: 13.6 g/dL (ref 12.0–15.0)
Lymphocytes Relative: 33.6 % (ref 12.0–46.0)
Lymphs Abs: 2.8 10*3/uL (ref 0.7–4.0)
MCHC: 33.4 g/dL (ref 30.0–36.0)
MCV: 90.8 fl (ref 78.0–100.0)
Monocytes Absolute: 0.6 10*3/uL (ref 0.1–1.0)
Monocytes Relative: 6.8 % (ref 3.0–12.0)
Neutro Abs: 4.8 10*3/uL (ref 1.4–7.7)
Neutrophils Relative %: 57.7 % (ref 43.0–77.0)
Platelets: 214 10*3/uL (ref 150.0–400.0)
RBC: 4.49 Mil/uL (ref 3.87–5.11)
RDW: 13.2 % (ref 11.5–15.5)
WBC: 8.3 10*3/uL (ref 4.0–10.5)

## 2021-07-31 LAB — LIPID PANEL
Cholesterol: 220 mg/dL — ABNORMAL HIGH (ref 0–200)
HDL: 42.9 mg/dL (ref >39.00)
NonHDL: 176.6
Total CHOL/HDL Ratio: 5
Triglycerides: 206 mg/dL — ABNORMAL HIGH (ref 0.0–149.0)
VLDL: 41.2 mg/dL — ABNORMAL HIGH (ref 0.0–40.0)

## 2021-07-31 LAB — COMPREHENSIVE METABOLIC PANEL
ALT: 36 U/L — ABNORMAL HIGH (ref 0–35)
AST: 28 U/L (ref 0–37)
Albumin: 4.7 g/dL (ref 3.5–5.2)
Alkaline Phosphatase: 74 U/L (ref 39–117)
BUN: 13 mg/dL (ref 6–23)
CO2: 29 mEq/L (ref 19–32)
Calcium: 9.8 mg/dL (ref 8.4–10.5)
Chloride: 101 mEq/L (ref 96–112)
Creatinine, Ser: 1.04 mg/dL (ref 0.40–1.20)
GFR: 58.16 mL/min — ABNORMAL LOW (ref >60.00)
Glucose, Bld: 92 mg/dL (ref 70–99)
Potassium: 4.3 mEq/L (ref 3.5–5.1)
Sodium: 139 mEq/L (ref 135–145)
Total Bilirubin: 0.5 mg/dL (ref 0.2–1.2)
Total Protein: 7.1 g/dL (ref 6.0–8.3)

## 2021-07-31 LAB — HEMOGLOBIN A1C: Hgb A1c MFr Bld: 6.7 % — ABNORMAL HIGH (ref 4.6–6.5)

## 2021-07-31 LAB — TSH: TSH: 2.62 u[IU]/mL (ref 0.35–5.50)

## 2021-07-31 LAB — LDL CHOLESTEROL, DIRECT: Direct LDL: 155 mg/dL

## 2022-03-27 ENCOUNTER — Ambulatory Visit: Payer: 59 | Admitting: Family Medicine

## 2022-03-27 ENCOUNTER — Encounter: Payer: Self-pay | Admitting: Family Medicine

## 2022-03-27 VITALS — BP 120/76 | HR 97 | Temp 98.4°F | Ht <= 58 in | Wt 174.2 lb

## 2022-03-27 DIAGNOSIS — L989 Disorder of the skin and subcutaneous tissue, unspecified: Secondary | ICD-10-CM

## 2022-03-27 NOTE — Progress Notes (Addendum)
    Jo Muhs T. Korina Tretter, MD, [place] at [facility] [place], [address]  Phone: [phone]  FAX: [place] - 61 y.o. female  MRN [id number]  Date of Birth: 1961-06-06  Date: 03/27/2022  PCP: Abner Greenspan, MD  Referral: Abner Greenspan, MD  Chief Complaint  Patient presents with   Skin Lesion    Right Calf   Subjective:   Jo Lamb is a 61 y.o. very pleasant female patient with Body mass index is 36.41 kg/m. who presents with the following:  Jo Lamb presents with a question of skin lesion on her leg, and she wanted it to get examined in the office.  This very pleasant lady has a area on her leg is been present there for all Years, has been progressively getting worse with raised edges and a change in the base.  Has been irritated and a little bit more than ulcerated, crusted appearance  Review of Systems is noted in the HPI, as appropriate  Objective:   BP 120/76   Pulse 97   Temp 98.4 F (36.9 C) (Oral)   Ht '4\' 10"'$  (1.473 m)   Wt 174 lb 3 oz (79 kg)   LMP 12/29/2010   SpO2 96%   BMI 36.41 kg/m   GEN: No acute distress; alert,appropriate. PULM: Breathing comfortably in no respiratory distress PSYCH: Normally interactive.     Nontender  Laboratory and Imaging Data:  Assessment and Plan:     ICD-10-CM   1. Leg skin lesion, right  L98.9 Ambulatory referral to Dermatology     Right leg lesion.  It is elevated like a seborrheic keratosis, however the base appears quite different than what you would normally see.  Worried that this is developed further and question whether or not this could be skin cancer such as squamous cell.  Think it is best evaluated by dermatology, and they may wish to do a biopsy for definitive answers.  I appreciate their help.  Orders placed today for conditions managed today: Orders Placed This Encounter  Procedures   Ambulatory  referral to Dermatology    Follow-up if needed: No follow-ups on file.  [employer] One speech-to-text software was used for transcription in this dictation.  Possible transcriptional errors can occur using Editor, commissioning.   Signed,  Jo Deed. Krisalyn Yankowski, MD   Outpatient Encounter Medications as of 03/27/2022  Medication Sig   imipramine (TOFRANIL) 25 MG tablet Take 2 tablets (50 mg total) by mouth at bedtime.   lisinopril-hydrochlorothiazide (ZESTORETIC) 10-12.5 MG tablet Take 1 tablet by mouth daily.   No facility-administered encounter medications on file as of 03/27/2022.

## 2022-04-09 ENCOUNTER — Telehealth: Payer: Self-pay | Admitting: Family Medicine

## 2022-04-09 DIAGNOSIS — L989 Disorder of the skin and subcutaneous tissue, unspecified: Secondary | ICD-10-CM | POA: Insufficient documentation

## 2022-04-09 NOTE — Telephone Encounter (Signed)
Forward

## 2022-04-09 NOTE — Telephone Encounter (Signed)
Pt called stating she last saw [facility] on 03/27/22 for a spot on her leg, pt stated "he put something in my chart saying it is "progressively getting better" pt said that is incorrect/error, it has gotten worse." The place on her leg is more red, a little oozing and itches. She also had a referral sent to [place], but first apt is not until 10/16/22, she would like another referral sent in to somewhere else where she could be seen quicker/ sooner. Please return a callback when possible, thank you.  Callback #: [id number]

## 2022-04-09 NOTE — Telephone Encounter (Signed)
Looks like I wrote the wrong word and meant to say worse instead of better, and this goes along with my memory and the remainder of the note.  I altered this as an addendum to reflect it.

## 2022-04-09 NOTE — Telephone Encounter (Signed)
fyi

## 2022-04-09 NOTE — Telephone Encounter (Signed)
I put in a referral and will route to Jo Lamb to see if a different practice can see her sooner  Reviewed the info from Dr Lorelei Pont and will cc to him to consider addendum if needed

## 2022-04-09 NOTE — Assessment & Plan Note (Signed)
Irritated , some redness and bothersome  Want to r/o skin cancer Referral done to dermatology  Rev notes from Dr Lorelei Pont

## 2022-04-15 ENCOUNTER — Ambulatory Visit: Payer: 59 | Admitting: Family

## 2022-04-15 ENCOUNTER — Encounter: Payer: Self-pay | Admitting: Family

## 2022-04-15 VITALS — BP 136/72 | HR 102 | Temp 98.6°F | Resp 16 | Ht <= 58 in | Wt 172.2 lb

## 2022-04-15 DIAGNOSIS — S81801A Unspecified open wound, right lower leg, initial encounter: Secondary | ICD-10-CM | POA: Insufficient documentation

## 2022-04-15 DIAGNOSIS — L249 Irritant contact dermatitis, unspecified cause: Secondary | ICD-10-CM | POA: Insufficient documentation

## 2022-04-15 MED ORDER — TRIAMCINOLONE ACETONIDE 0.1 % EX CREA: 1.0000 | TOPICAL_CREAM | Freq: Two times a day (BID) | CUTANEOUS | 0 refills | Status: DC

## 2022-04-15 MED ORDER — DOXYCYCLINE HYCLATE 100 MG PO TABS: 100.0000 mg | ORAL_TABLET | Freq: Two times a day (BID) | ORAL | 0 refills | Status: AC

## 2022-04-15 NOTE — Progress Notes (Signed)
Established Patient Office Visit  Subjective:  Patient ID: Jo Lamb, female    DOB: 1961/04/12  Age: 61 y.o. MRN: [id number]  CC:  Chief Complaint  Patient presents with   Rash    On right arm and leg    HPI Jo Lamb is here today for follow up.   Pt is with acute concerns.  Pt noticed a new lesion back a few months ago, saw Dr. Lorelei Pont 03/27/22 for this lesion. Was not given anything for medication but was referred to dermatology.   Since has surrounding redness that has increased in size, is itchy, and uncomfortable. Slight drainage from site that was yellowish in color. She also has a red rash that started on the anterior forearm that is also itchy with vesicular lesions about     Past Medical History:  Diagnosis Date   Back pain    Carpal tunnel syndrome on both sides 02/2004// 10/22/2004   Nerve conduction study x's 2   Colon polyps    colonoscopy- polyps, hemorrhoids, divertic   Fibroids    History of migraine headaches    Hypertension    White coat   IBS (irritable bowel syndrome)    Neck pain     Past Surgical History:  Procedure Laterality Date   MRI     MRI of head// Negative   TUBAL LIGATION     VAGINAL DELIVERY     x's two    Family History  Problem Relation Age of Onset   Hypertension Father     Social History   Socioeconomic History   Marital status: Married    Spouse name: Not on file   Number of children: Not on file   Years of education: Not on file   Highest education level: Not on file  Occupational History   Not on file  Tobacco Use   Smoking status: Never   Smokeless tobacco: Never  Substance and Sexual Activity   Alcohol use: No    Alcohol/week: 0.0 standard drinks of alcohol   Drug use: No   Sexual activity: Not on file  Other Topics Concern   Not on file  Social History Narrative   Occasionally caffeine- nor more than 1 serv/ day   Social Determinants of Health   Financial Resource Strain: Not on  file  Food Insecurity: Not on file  Transportation Needs: Not on file  Physical Activity: Not on file  Stress: Not on file  Social Connections: Not on file  Intimate Partner Violence: Not on file    Outpatient Medications Prior to Visit  Medication Sig Dispense Refill   imipramine (TOFRANIL) 25 MG tablet Take 2 tablets (50 mg total) by mouth at bedtime. 180 tablet 3   lisinopril-hydrochlorothiazide (ZESTORETIC) 10-12.5 MG tablet Take 1 tablet by mouth daily. 90 tablet 3   No facility-administered medications prior to visit.    No Known Allergies       Objective:    Physical Exam Constitutional:      General: She is not in acute distress.    Appearance: Normal appearance. She is obese. She is not ill-appearing, toxic-appearing or diaphoretic.  Pulmonary:     Effort: Pulmonary effort is normal.  Skin:    Findings: Rash (right anterior arm with vesicular lesion rash with warmth and erythema) and wound (right lower posterior calve with scabbed dry wound with no wet drainage, with erythema without warmth surrounding) present.  Neurological:     General: No focal deficit  present.     Mental Status: She is alert and oriented to person, place, and time.  Psychiatric:        Mood and Affect: Mood normal.        Behavior: Behavior normal.        Thought Content: Thought content normal.        Judgment: Judgment normal.          BP 136/72   Pulse (!) 102   Temp 98.6 F (37 C)   Resp 16   Ht '4\' 10"'$  (1.473 m)   Wt 172 lb 4 oz (78.1 kg)   LMP 12/29/2010   SpO2 98%   BMI 36.00 kg/m  Wt Readings from Last 3 Encounters:  04/15/22 172 lb 4 oz (78.1 kg)  03/27/22 174 lb 3 oz (79 kg)  07/30/21 173 lb 4 oz (78.6 kg)     Health Maintenance Due  Topic Date Due   MAMMOGRAM  Never done   Zoster Vaccines- Shingrix (1 of 2) Never done   COLONOSCOPY (Pts 45-68yr Insurance coverage will need to be confirmed)  03/29/2015    There are no preventive care reminders to  display for this patient.  Lab Results  Component Value Date   TSH 2.62 07/30/2021   Lab Results  Component Value Date   WBC 8.3 07/30/2021   HGB 13.6 07/30/2021   HCT 40.7 07/30/2021   MCV 90.8 07/30/2021   PLT 214.0 07/30/2021   Lab Results  Component Value Date   NA 139 07/30/2021   K 4.3 07/30/2021   CO2 29 07/30/2021   GLUCOSE 92 07/30/2021   BUN 13 07/30/2021   CREATININE 1.04 07/30/2021   BILITOT 0.5 07/30/2021   ALKPHOS 74 07/30/2021   AST 28 07/30/2021   ALT 36 (H) 07/30/2021   PROT 7.1 07/30/2021   ALBUMIN 4.7 07/30/2021   CALCIUM 9.8 07/30/2021   ANIONGAP 12 04/05/2016   GFR 58.16 (L) 07/30/2021   Lab Results  Component Value Date   CHOL 220 (H) 07/30/2021   Lab Results  Component Value Date   HDL 42.90 07/30/2021   Lab Results  Component Value Date   LDLCALC 157 (H) 07/02/2020   Lab Results  Component Value Date   TRIG 206.0 (H) 07/30/2021   Lab Results  Component Value Date   CHOLHDL 5 07/30/2021   Lab Results  Component Value Date   HGBA1C 6.7 (H) 07/30/2021      Assessment & Plan:   Problem List Items Addressed This Visit       Musculoskeletal and Integument   Irritant contact dermatitis - Primary    Suspected poison oak or ivy exposure however hesitant to treat with oral steroid rx triamcinolone cream Will consider prednisone if no improvement Pt states no exposure to poison oak to her knowledge, hasn't been outside      Relevant Medications   doxycycline (VIBRA-TABS) 100 MG tablet   triamcinolone cream (KENALOG) 0.1 %     Other   Wound of right leg, initial encounter    Suspected MRSA  Unable to get wound culture as no wet areas Advised pt to no longer use essential oils  rx doxycycline 100 mg po bid x 10 days Monitor daily for worsening s/s infection         Meds ordered this encounter  Medications   doxycycline (VIBRA-TABS) 100 MG tablet    Sig: Take 1 tablet (100 mg total) by mouth 2 (two) times daily for 10  days.    Dispense:  20 tablet    Refill:  0    Order Specific Question:   Supervising Provider    Answer:   BEDSOLE, AMY E [[id number]]   triamcinolone cream (KENALOG) 0.1 %    Sig: Apply 1 Application topically 2 (two) times daily.    Dispense:  30 g    Refill:  0    Order Specific Question:   Supervising Provider    Answer:   Diona Browner, AMY E [[id number]]    Follow-up: Return in about 1 week (around 04/22/2022) for with Dr. Glori Bickers if available otherwise with me for lesion f/u .    Eugenia Pancoast, FNP

## 2022-04-15 NOTE — Patient Instructions (Signed)
Due to recent changes in healthcare laws, you may see results of your imaging and/or laboratory studies on MyChart before I have had a chance to review them.  I understand that in some cases there may be results that are confusing or concerning to you. Please understand that not all results are received at the same time and often I may need to interpret multiple results in order to provide you with the best plan of care or course of treatment. Therefore, I ask that you please give me 2 business days to thoroughly review all your results before contacting my office for clarification. Should we see a critical lab result, you will be contacted sooner.   It was a pleasure seeing you today! Please do not hesitate to reach out with any questions and or concerns.  Regards,   Bob Eastwood FNP-C  

## 2022-04-15 NOTE — Assessment & Plan Note (Signed)
Suspected poison oak or ivy exposure however hesitant to treat with oral steroid rx triamcinolone cream Will consider prednisone if no improvement Pt states no exposure to poison oak to her knowledge, hasn't been outside

## 2022-04-15 NOTE — Assessment & Plan Note (Signed)
Suspected MRSA  Unable to get wound culture as no wet areas Advised pt to no longer use essential oils  rx doxycycline 100 mg po bid x 10 days Monitor daily for worsening s/s infection

## 2022-04-22 ENCOUNTER — Ambulatory Visit: Payer: 59 | Admitting: Family

## 2022-04-22 ENCOUNTER — Encounter: Payer: Self-pay | Admitting: Family

## 2022-04-22 DIAGNOSIS — S81801A Unspecified open wound, right lower leg, initial encounter: Secondary | ICD-10-CM

## 2022-04-22 DIAGNOSIS — L249 Irritant contact dermatitis, unspecified cause: Secondary | ICD-10-CM | POA: Diagnosis not present

## 2022-04-22 NOTE — Assessment & Plan Note (Signed)
Improving since last visit.  Continue three days duration left of doxycycline Can apply lotion, fragrance free to dry skin if irritated.  Will try to get appt with dermatology sooner than end of September as still with raised scaly texture to central lesion and pt concerned bc family history with mom with fatal SCC.

## 2022-04-22 NOTE — Assessment & Plan Note (Signed)
Right anterior forearm rash has completed resolved with use of triamcinolone cream.

## 2022-04-22 NOTE — Progress Notes (Signed)
Established Patient Office Visit  Subjective:  Patient ID: Jo Lamb, female    DOB: 07-21-1961  Age: 61 y.o. MRN: [id number]  CC:  Chief Complaint  Patient presents with   Dermatitis    HPI JAMIA HOBAN is here today for follow up.   Wound of right leg, was suspected MRSA. Rx was given for doxycycline 100 mg po bid x 10 days. She has had incredible improvement with the doxycycline. For arm rash was applying triamcinolone cream to arm and has completely resolved, no longer having to take the cream.  Still has about three days left of doxycycline.   No longer with itching, no drainage.   Appt with dermatology end of September 2024 with [facility].    Past Medical History:  Diagnosis Date   Back pain    Carpal tunnel syndrome on both sides 02/2004// 10/22/2004   Nerve conduction study x's 2   Colon polyps    colonoscopy- polyps, hemorrhoids, divertic   Fibroids    History of migraine headaches    Hypertension    White coat   IBS (irritable bowel syndrome)    Neck pain     Past Surgical History:  Procedure Laterality Date   MRI     MRI of head// Negative   TUBAL LIGATION     VAGINAL DELIVERY     x's two    Family History  Problem Relation Age of Onset   Hypertension Father     Social History   Socioeconomic History   Marital status: Married    Spouse name: Not on file   Number of children: Not on file   Years of education: Not on file   Highest education level: Not on file  Occupational History   Not on file  Tobacco Use   Smoking status: Never   Smokeless tobacco: Never  Substance and Sexual Activity   Alcohol use: No    Alcohol/week: 0.0 standard drinks of alcohol   Drug use: No   Sexual activity: Not on file  Other Topics Concern   Not on file  Social History Narrative   Occasionally caffeine- nor more than 1 serv/ day   Social Determinants of Health   Financial Resource Strain: Not on file  Food Insecurity: Not  on file  Transportation Needs: Not on file  Physical Activity: Not on file  Stress: Not on file  Social Connections: Not on file  Intimate Partner Violence: Not on file    Outpatient Medications Prior to Visit  Medication Sig Dispense Refill   doxycycline (VIBRA-TABS) 100 MG tablet Take 1 tablet (100 mg total) by mouth 2 (two) times daily for 10 days. 20 tablet 0   imipramine (TOFRANIL) 25 MG tablet Take 2 tablets (50 mg total) by mouth at bedtime. 180 tablet 3   lisinopril-hydrochlorothiazide (ZESTORETIC) 10-12.5 MG tablet Take 1 tablet by mouth daily. 90 tablet 3   triamcinolone cream (KENALOG) 0.1 % Apply 1 Application topically 2 (two) times daily. 30 g 0   No facility-administered medications prior to visit.    No Known Allergies        Objective:    Physical Exam Constitutional:      General: She is not in acute distress.    Appearance: Normal appearance. She is obese. She is not ill-appearing, toxic-appearing or diaphoretic.  Pulmonary:     Effort: Pulmonary effort is normal.  Skin:    Findings: Lesion (right posterior calf lesion improving with surrounidng erythema  slightly. still raised slightly scaly lesion in the center with mild tenderness, improving. dry skin surrounding) present.  Neurological:     General: No focal deficit present.     Mental Status: She is alert and oriented to person, place, and time.  Psychiatric:        Mood and Affect: Mood normal.        Behavior: Behavior normal.        Thought Content: Thought content normal.        Judgment: Judgment normal.          BP 126/74   Pulse 87   Temp 97.9 F (36.6 C)   Resp 16   Ht '4\' 10"'$  (1.473 m)   Wt 173 lb 4 oz (78.6 kg)   LMP 12/29/2010   SpO2 97%   BMI 36.21 kg/m  Wt Readings from Last 3 Encounters:  04/22/22 173 lb 4 oz (78.6 kg)  04/15/22 172 lb 4 oz (78.1 kg)  03/27/22 174 lb 3 oz (79 kg)     Health Maintenance Due  Topic Date Due   MAMMOGRAM  Never done   Zoster  Vaccines- Shingrix (1 of 2) Never done   COLONOSCOPY (Pts 45-87yr Insurance coverage will need to be confirmed)  03/29/2015    There are no preventive care reminders to display for this patient.  Lab Results  Component Value Date   TSH 2.62 07/30/2021   Lab Results  Component Value Date   WBC 8.3 07/30/2021   HGB 13.6 07/30/2021   HCT 40.7 07/30/2021   MCV 90.8 07/30/2021   PLT 214.0 07/30/2021   Lab Results  Component Value Date   NA 139 07/30/2021   K 4.3 07/30/2021   CO2 29 07/30/2021   GLUCOSE 92 07/30/2021   BUN 13 07/30/2021   CREATININE 1.04 07/30/2021   BILITOT 0.5 07/30/2021   ALKPHOS 74 07/30/2021   AST 28 07/30/2021   ALT 36 (H) 07/30/2021   PROT 7.1 07/30/2021   ALBUMIN 4.7 07/30/2021   CALCIUM 9.8 07/30/2021   ANIONGAP 12 04/05/2016   GFR 58.16 (L) 07/30/2021   Lab Results  Component Value Date   CHOL 220 (H) 07/30/2021   Lab Results  Component Value Date   HDL 42.90 07/30/2021   Lab Results  Component Value Date   LDLCALC 157 (H) 07/02/2020   Lab Results  Component Value Date   TRIG 206.0 (H) 07/30/2021   Lab Results  Component Value Date   CHOLHDL 5 07/30/2021   Lab Results  Component Value Date   HGBA1C 6.7 (H) 07/30/2021      Assessment & Plan:   Problem List Items Addressed This Visit       Musculoskeletal and Integument   Irritant contact dermatitis    Right anterior forearm rash has completed resolved with use of triamcinolone cream.         Other   Wound of right leg, initial encounter    Improving since last visit.  Continue three days duration left of doxycycline Can apply lotion, fragrance free to dry skin if irritated.  Will try to get appt with dermatology sooner than end of September as still with raised scaly texture to central lesion and pt concerned bc family history with mom with fatal SCC.        No orders of the defined types were placed in this encounter.   Follow-up: No follow-ups on file.     TEugenia Pancoast FNP

## 2022-05-30 NOTE — Telephone Encounter (Signed)
Message to [facility] to see if able to see sooner than scheduled (10/06/22) due to worsening skin spot

## 2022-06-03 ENCOUNTER — Ambulatory Visit (INDEPENDENT_AMBULATORY_CARE_PROVIDER_SITE_OTHER): Payer: 59 | Admitting: Family Medicine

## 2022-06-03 ENCOUNTER — Encounter: Payer: Self-pay | Admitting: Family Medicine

## 2022-06-03 ENCOUNTER — Ambulatory Visit (INDEPENDENT_AMBULATORY_CARE_PROVIDER_SITE_OTHER)
Admission: RE | Admit: 2022-06-03 | Discharge: 2022-06-03 | Disposition: A | Discharge status: Home / Self Care | Payer: 59 | Source: Ambulatory Visit | Attending: Family Medicine | Admitting: Family Medicine

## 2022-06-03 DIAGNOSIS — J189 Pneumonia, unspecified organism: Secondary | ICD-10-CM | POA: Diagnosis not present

## 2022-06-03 DIAGNOSIS — J209 Acute bronchitis, unspecified: Secondary | ICD-10-CM

## 2022-06-03 DIAGNOSIS — Z8701 Personal history of pneumonia (recurrent): Secondary | ICD-10-CM | POA: Insufficient documentation

## 2022-06-03 MED ORDER — PREDNISONE 10 MG PO TABS: ORAL_TABLET | ORAL | 0 refills | Status: DC

## 2022-06-03 MED ORDER — AZITHROMYCIN 250 MG PO TABS: ORAL_TABLET | ORAL | 0 refills | Status: DC

## 2022-06-03 MED ORDER — PROMETHAZINE-DM 6.25-15 MG/5ML PO SYRP: 5.0000 mL | ORAL_SOLUTION | Freq: Four times a day (QID) | ORAL | 0 refills | Status: DC | PRN

## 2022-06-03 MED ORDER — AMOXICILLIN-POT CLAVULANATE 875-125 MG PO TABS: 1.0000 | ORAL_TABLET | Freq: Two times a day (BID) | ORAL | 0 refills | Status: DC

## 2022-06-03 MED ORDER — ALBUTEROL SULFATE HFA 108 (90 BASE) MCG/ACT IN AERS: 2.0000 | INHALATION_SPRAY | RESPIRATORY_TRACT | 1 refills | Status: DC | PRN

## 2022-06-03 NOTE — Assessment & Plan Note (Signed)
With prod cough/ wheezing and low grade temp  Neg covid test at home  Rhonchi on exam  cxr ordered-pending rad rev  Prednisone 40 mg taper ordered (disc poss side eff) prometh DM-with caution of sedation Albuterol mdi prn for wheeze ER precautions noted  Symptomatic care discussed  Update if not starting to improve in a week or if worsening

## 2022-06-03 NOTE — Progress Notes (Signed)
Subjective:    Patient ID: Jo Lamb, female    DOB: 1961-01-31, 61 y.o.   MRN: [id number]  HPI Pt presents with cough and congestion /fever  Wt Readings from Last 3 Encounters:  06/03/22 171 lb (77.6 kg)  04/22/22 173 lb 4 oz (78.6 kg)  04/15/22 172 lb 4 oz (78.1 kg)   35.74 kg/m  10 d ago- started coughing after work/chest was sore  Then 3 days later hit with nasal symptoms and cold also with temp Chest congestion is worse than nasal Phlegm was yellow /now more white  Some wheezing   Some headache from cough   Did covid test on Tuesday- it was negative   No flu contacts  No RSV contacts   Her husband was sick a week before her - was just a cold     Temp 100.4 last night  Body aches and chills at times    Otc Gel pack for headache  Mucinex DM Saline mist    Has not used albuterol for years   She is prone to bronchitis   Patient Active Problem List   Diagnosis Date Noted   Acute bronchitis 06/03/2022   Irritant contact dermatitis 04/15/2022   Wound of right leg, initial encounter 04/15/2022   Elevated transaminase level 02/16/2019   Prediabetes 11/14/2012   BACK PAIN WITH RADICULOPATHY 11/29/2008   Essential hypertension 11/01/2008   Class 2 severe obesity due to excess calories with serious comorbidity and body mass index (BMI) of 36.0 to 36.9 in adult [facility]) 08/11/2008   GERD 08/11/2008   Hyperlipidemia 07/07/2008   NECK PAIN, CHRONIC 07/21/2007   Migraine headache 07/20/2007   IBS 07/20/2007   Past Medical History:  Diagnosis Date   Back pain    Carpal tunnel syndrome on both sides 02/2004// 10/22/2004   Nerve conduction study x's 2   Colon polyps    colonoscopy- polyps, hemorrhoids, divertic   Fibroids    History of migraine headaches    Hypertension    White coat   IBS (irritable bowel syndrome)    Neck pain    Past Surgical History:  Procedure Laterality Date   MRI     MRI of head// Negative   TUBAL LIGATION     VAGINAL  DELIVERY     x's two   Social History   Tobacco Use   Smoking status: Never   Smokeless tobacco: Never  Substance Use Topics   Alcohol use: No    Alcohol/week: 0.0 standard drinks of alcohol   Drug use: No   Family History  Problem Relation Age of Onset   Hypertension Father    No Known Allergies Current Outpatient Medications on File Prior to Visit  Medication Sig Dispense Refill   imipramine (TOFRANIL) 25 MG tablet Take 2 tablets (50 mg total) by mouth at bedtime. 180 tablet 3   lisinopril-hydrochlorothiazide (ZESTORETIC) 10-12.5 MG tablet Take 1 tablet by mouth daily. 90 tablet 3   triamcinolone cream (KENALOG) 0.1 % Apply 1 Application topically 2 (two) times daily. 30 g 0   No current facility-administered medications on file prior to visit.     Review of Systems  Constitutional:  Positive for appetite change and fatigue. Negative for fever.  HENT:  Positive for congestion and postnasal drip. Negative for ear pain, rhinorrhea, sinus pressure, sneezing and sore throat.   Eyes:  Negative for pain and discharge.  Respiratory:  Positive for cough and wheezing. Negative for shortness of breath and stridor.  Cardiovascular:  Negative for chest pain.  Gastrointestinal:  Negative for diarrhea, nausea and vomiting.  Genitourinary:  Negative for frequency, hematuria and urgency.  Musculoskeletal:  Negative for arthralgias and myalgias.  Skin:  Negative for rash.  Neurological:  Positive for headaches. Negative for dizziness, weakness and light-headedness.  Psychiatric/Behavioral:  Negative for confusion and dysphoric mood.        Objective:   Physical Exam Constitutional:      General: She is not in acute distress.    Appearance: Normal appearance. She is well-developed. She is obese. She is not ill-appearing, toxic-appearing or diaphoretic.  HENT:     Head: Normocephalic and atraumatic.     Comments: Nares are injected and congested    No facial tenderness    Right  Ear: Tympanic membrane, ear canal and external ear normal.     Left Ear: Tympanic membrane, ear canal and external ear normal.     Nose: Congestion and rhinorrhea present.     Comments: Boggy nares     Mouth/Throat:     Mouth: Mucous membranes are moist.     Pharynx: Oropharynx is clear. No oropharyngeal exudate or posterior oropharyngeal erythema.     Comments: Clear pnd  Eyes:     General:        Right eye: No discharge.        Left eye: No discharge.     Conjunctiva/sclera: Conjunctivae normal.     Pupils: Pupils are equal, round, and reactive to light.  Cardiovascular:     Rate and Rhythm: Normal rate.     Heart sounds: Normal heart sounds.  Pulmonary:     Effort: Pulmonary effort is normal. No respiratory distress.     Breath sounds: No stridor. Wheezing and rhonchi present. No rales.     Comments: Scattered rhonchi bilateral Wheezes on forced exp No rales or crackles No sob with speech Chest:     Chest wall: No tenderness.  Musculoskeletal:     Cervical back: Normal range of motion and neck supple.  Lymphadenopathy:     Cervical: No cervical adenopathy.  Skin:    General: Skin is warm and dry.     Capillary Refill: Capillary refill takes less than 2 seconds.     Findings: No rash.  Neurological:     Mental Status: She is alert.     Cranial Nerves: No cranial nerve deficit.  Psychiatric:        Mood and Affect: Mood normal.           Assessment & Plan:   Problem List Items Addressed This Visit       Respiratory   Acute bronchitis    With prod cough/ wheezing and low grade temp  Neg covid test at home  Rhonchi on exam  cxr ordered-pending rad rev  Prednisone 40 mg taper ordered (disc poss side eff) prometh DM-with caution of sedation Albuterol mdi prn for wheeze ER precautions noted  Symptomatic care discussed  Update if not starting to improve in a week or if worsening        Relevant Orders   DG Chest 2 View (Completed)   Pneumonia    RLL  infiltrate on cxr  Will px augmentin and zithromax in addn to the prednisone and cough medication   Plan f/u in 1-2 weeks for visit  ER parameters discussed       Relevant Medications   promethazine-dextromethorphan (PROMETHAZINE-DM) 6.25-15 MG/5ML syrup   albuterol (VENTOLIN HFA) 108 (90  Base) MCG/ACT inhaler   amoxicillin-clavulanate (AUGMENTIN) 875-125 MG tablet   azithromycin (ZITHROMAX Z-PAK) 250 MG tablet

## 2022-06-03 NOTE — Assessment & Plan Note (Addendum)
RLL infiltrate on cxr  Will px augmentin and zithromax in addn to the prednisone and cough medication   Plan f/u in 1-2 weeks for visit  ER parameters discussed

## 2022-06-03 NOTE — Patient Instructions (Addendum)
Take prednisone as directed  It may make you feel hyper and hungry   Try the prometh DM for cough  You can take plain mucinex also  Tylenol for fever if needed   Albuterol for wheezing as needed   Chest xray now -we will call with the result

## 2022-06-12 NOTE — Telephone Encounter (Signed)
Pt was seen by Dr Evorn Gong with [facility] in [place]. Referral updated.   Nothing further needed.

## 2022-06-13 ENCOUNTER — Encounter: Payer: Self-pay | Admitting: Family Medicine

## 2022-06-13 ENCOUNTER — Ambulatory Visit: Payer: 59 | Admitting: Family Medicine

## 2022-06-13 VITALS — BP 144/82 | HR 88 | Temp 98.2°F | Ht <= 58 in | Wt 170.2 lb

## 2022-06-13 DIAGNOSIS — J189 Pneumonia, unspecified organism: Secondary | ICD-10-CM

## 2022-06-13 MED ORDER — PROMETHAZINE-DM 6.25-15 MG/5ML PO SYRP: 5.0000 mL | ORAL_SOLUTION | Freq: Four times a day (QID) | ORAL | 0 refills | Status: DC | PRN

## 2022-06-13 NOTE — Assessment & Plan Note (Signed)
RLL infiltrate noted at 9/5 visit  Clinically improved with course of augmentin and prednisone Disc remaining cough and symptom care Reassuring exam  Will f/u in 3 wk for re check and cxr  Rev ER parameters  inst to call if no further improvement Will continue fluids and rest as needed Consider pna vaccine when recovered

## 2022-06-13 NOTE — Patient Instructions (Addendum)
Take the cough medicine  Rest and drink lots of fluids   Take the cough medicine as needed   If symptoms worsen let us know  If you get severely short of breath -go to the ER   Follow up in 3  weeks for re check and xray   Will consider a pneumonia shot when recovered

## 2022-06-13 NOTE — Progress Notes (Signed)
Subjective:    Patient ID: Jo Lamb, female    DOB: 1961/02/15, 61 y.o.   MRN: [id number]  HPI Pt presents for f/u of pneumonia   Wt Readings from Last 3 Encounters:  06/13/22 170 lb 4 oz (77.2 kg)  06/03/22 171 lb (77.6 kg)  04/22/22 173 lb 4 oz (78.6 kg)   35.58 kg/m  Pulse ox 97% today   Definitely feels better  Still fair amount of cough but can sleep now   Is very tired   Phlegm is yellow - today less/ more white to clear  Perhaps tight feeling until she coughs and then improved  No wheezing in between cough fits   Drinking lots of fluids- mainly water   Was seen on 9/5 with productive cough and fever   RLL infiltrate noted on cxr  DG Chest 2 View  Result Date: 06/03/2022 CLINICAL DATA:  Productive cough and wheezing for 10 days. EXAM: CHEST - 2 VIEW COMPARISON:  12/14/2014 FINDINGS: The cardiac silhouette, mediastinal and hilar contours are within normal limits. Airspace opacity the at the right lung base consistent with right lower lobe infiltrate/. No pleural effusions or pulmonary lesions. The bony thorax is intact. IMPRESSION: Right lower lobe infiltrate. Electronically Signed   By: Marijo Sanes M.D.   On: 06/03/2022 15:37    Treated with augmentin and zithromax  Also prednisone  Prometh DM cough med  She did not go to work last week  Went back [employer] tired   Patient Active Problem List   Diagnosis Date Noted   Acute bronchitis 06/03/2022   Pneumonia 06/03/2022   Irritant contact dermatitis 04/15/2022   Wound of right leg, initial encounter 04/15/2022   Elevated transaminase level 02/16/2019   Prediabetes 11/14/2012   BACK PAIN WITH RADICULOPATHY 11/29/2008   Essential hypertension 11/01/2008   Class 2 severe obesity due to excess calories with serious comorbidity and body mass index (BMI) of 36.0 to 36.9 in adult [facility]) 08/11/2008   GERD 08/11/2008   Hyperlipidemia 07/07/2008   NECK PAIN, CHRONIC 07/21/2007   Migraine headache  07/20/2007   IBS 07/20/2007   Past Medical History:  Diagnosis Date   Back pain    Carpal tunnel syndrome on both sides 02/2004// 10/22/2004   Nerve conduction study x's 2   Colon polyps    colonoscopy- polyps, hemorrhoids, divertic   Fibroids    History of migraine headaches    Hypertension    White coat   IBS (irritable bowel syndrome)    Neck pain    Past Surgical History:  Procedure Laterality Date   MRI     MRI of head// Negative   TUBAL LIGATION     VAGINAL DELIVERY     x's two   Social History   Tobacco Use   Smoking status: Never   Smokeless tobacco: Never  Substance Use Topics   Alcohol use: No    Alcohol/week: 0.0 standard drinks of alcohol   Drug use: No   Family History  Problem Relation Age of Onset   Hypertension Father    No Known Allergies Current Outpatient Medications on File Prior to Visit  Medication Sig Dispense Refill   albuterol (VENTOLIN HFA) 108 (90 Base) MCG/ACT inhaler Inhale 2 puffs into the lungs every 4 (four) hours as needed for wheezing. 1 each 1   imipramine (TOFRANIL) 25 MG tablet Take 2 tablets (50 mg total) by mouth at bedtime. 180 tablet 3   lisinopril-hydrochlorothiazide (ZESTORETIC) 10-12.5 MG tablet  Take 1 tablet by mouth daily. 90 tablet 3   predniSONE (DELTASONE) 10 MG tablet Take 4 pills once daily by mouth for 3 days, then 3 pills daily for 3 days, then 2 pills daily for 3 days then 1 pill daily for 3 days then stop 30 tablet 0   triamcinolone cream (KENALOG) 0.1 % Apply 1 Application topically 2 (two) times daily. 30 g 0   No current facility-administered medications on file prior to visit.      Review of Systems  Constitutional:  Positive for fatigue. Negative for activity change, appetite change, fever and unexpected weight change.  HENT:  Negative for congestion, ear pain, rhinorrhea, sinus pressure and sore throat.   Eyes:  Negative for pain, redness and visual disturbance.  Respiratory:  Positive for cough.  Negative for shortness of breath and wheezing.   Cardiovascular:  Negative for chest pain, palpitations and leg swelling.  Gastrointestinal:  Negative for abdominal pain, blood in stool, constipation and diarrhea.  Endocrine: Negative for polydipsia and polyuria.  Genitourinary:  Negative for dysuria, frequency and urgency.  Musculoskeletal:  Negative for arthralgias, back pain and myalgias.  Skin:  Negative for pallor and rash.  Allergic/Immunologic: Negative for environmental allergies.  Neurological:  Negative for dizziness, syncope and headaches.  Hematological:  Negative for adenopathy. Does not bruise/bleed easily.  Psychiatric/Behavioral:  Negative for decreased concentration and dysphoric mood. The patient is not nervous/anxious.        Objective:   Physical Exam Constitutional:      General: She is not in acute distress.    Appearance: Normal appearance. She is well-developed. She is obese. She is not ill-appearing or diaphoretic.  HENT:     Head: Normocephalic and atraumatic.     Mouth/Throat:     Mouth: Mucous membranes are moist.     Pharynx: No oropharyngeal exudate or posterior oropharyngeal erythema.  Eyes:     General:        Right eye: No discharge.        Left eye: No discharge.     Conjunctiva/sclera: Conjunctivae normal.     Pupils: Pupils are equal, round, and reactive to light.  Neck:     Thyroid: No thyromegaly.     Vascular: No carotid bruit or JVD.  Cardiovascular:     Rate and Rhythm: Normal rate and regular rhythm.     Heart sounds: Normal heart sounds.     No gallop.  Pulmonary:     Effort: Pulmonary effort is normal. No respiratory distress.     Breath sounds: No stridor. Rales present. No wheezing or rhonchi.     Comments: Scant rales at R base  Good air exch No wheeze even on forced expiration    Chest:     Chest wall: No tenderness.  Abdominal:     General: There is no distension or abdominal bruit.     Palpations: Abdomen is soft.   Musculoskeletal:     Cervical back: Normal range of motion and neck supple.     Right lower leg: No edema.     Left lower leg: No edema.  Lymphadenopathy:     Cervical: No cervical adenopathy.  Skin:    General: Skin is warm and dry.     Coloration: Skin is not pale.     Findings: No rash.  Neurological:     Mental Status: She is alert.     Coordination: Coordination normal.     Deep Tendon Reflexes: Reflexes are  normal and symmetric. Reflexes normal.  Psychiatric:        Mood and Affect: Mood normal.           Assessment & Plan:   Problem List Items Addressed This Visit       Respiratory   Pneumonia - Primary    RLL infiltrate noted at 9/5 visit  Clinically improved with course of augmentin and prednisone Disc remaining cough and symptom care Reassuring exam  Will f/u in 3 wk for re check and cxr  Rev ER parameters  inst to call if no further improvement Will continue fluids and rest as needed Consider pna vaccine when recovered       Relevant Medications   promethazine-dextromethorphan (PROMETHAZINE-DM) 6.25-15 MG/5ML syrup

## 2022-07-02 ENCOUNTER — Ambulatory Visit: Payer: 59 | Admitting: Dermatology

## 2022-07-04 ENCOUNTER — Ambulatory Visit (INDEPENDENT_AMBULATORY_CARE_PROVIDER_SITE_OTHER)
Admission: RE | Admit: 2022-07-04 | Discharge: 2022-07-04 | Disposition: A | Discharge status: Home / Self Care | Payer: 59 | Source: Ambulatory Visit | Attending: Family Medicine | Admitting: Family Medicine

## 2022-07-04 ENCOUNTER — Ambulatory Visit: Payer: 59 | Admitting: Family Medicine

## 2022-07-04 ENCOUNTER — Encounter: Payer: Self-pay | Admitting: Family Medicine

## 2022-07-04 VITALS — BP 132/82 | HR 85 | Temp 98.4°F | Ht <= 58 in | Wt 173.8 lb

## 2022-07-04 DIAGNOSIS — J189 Pneumonia, unspecified organism: Secondary | ICD-10-CM

## 2022-07-04 DIAGNOSIS — R0981 Nasal congestion: Secondary | ICD-10-CM | POA: Insufficient documentation

## 2022-07-04 NOTE — Assessment & Plan Note (Signed)
RLL infiltrate, treated a month ago Continues to gradually improve Still some fatigue  Cough is improving (scant white phlegm) and no fever Recommend rest/fluids and gradually inc activity  cxr ordered today-pend rad rev  Expectorant prn  Update if not starting to improve in a week or if worsening  If cough continues-notable she is on ace as well

## 2022-07-04 NOTE — Assessment & Plan Note (Signed)
Mild  In midst of recovery from pneumonia Recommend saline ns Can try flonase otc during allergy season also

## 2022-07-04 NOTE — Progress Notes (Signed)
Subjective:    Patient ID: Jo Lamb, female    DOB: 17-Jul-1961, 61 y.o.   MRN: [id number]  HPI Pt presents for f/u of pneumonia   Wt Readings from Last 3 Encounters:  07/04/22 173 lb 12.8 oz (78.8 kg)  06/13/22 170 lb 4 oz (77.2 kg)  06/03/22 171 lb (77.6 kg)   36.32 kg/m  Pulse ox 97% today   Treated with augmentin and zithromax on 06/03/22  for RLL infiltrate  Was feeling better the following week Here for re check and cxr   Has not had a pna vaccine   Still has some cough -nothing like it was  Mostly in the am , little during the day   Phlegm : mainly white   Still very tired - sleeping pretty good  No wheezing    Had a skin spot late June- saw Dr Lorelei Pont, it got infected Had abx  Then had a weird spot on arm (? [place])  Then she got sick   Patient Active Problem List   Diagnosis Date Noted   Pneumonia 06/03/2022   Irritant contact dermatitis 04/15/2022   Wound of right leg, initial encounter 04/15/2022   Elevated transaminase level 02/16/2019   Prediabetes 11/14/2012   BACK PAIN WITH RADICULOPATHY 11/29/2008   Essential hypertension 11/01/2008   Class 2 severe obesity due to excess calories with serious comorbidity and body mass index (BMI) of 36.0 to 36.9 in adult [facility]) 08/11/2008   GERD 08/11/2008   Hyperlipidemia 07/07/2008   NECK PAIN, CHRONIC 07/21/2007   Migraine headache 07/20/2007   IBS 07/20/2007   Past Medical History:  Diagnosis Date   Back pain    Carpal tunnel syndrome on both sides 02/2004// 10/22/2004   Nerve conduction study x's 2   Colon polyps    colonoscopy- polyps, hemorrhoids, divertic   Fibroids    History of migraine headaches    Hypertension    White coat   IBS (irritable bowel syndrome)    Neck pain    Past Surgical History:  Procedure Laterality Date   MRI     MRI of head// Negative   TUBAL LIGATION     VAGINAL DELIVERY     x's two   Social History   Tobacco Use   Smoking status: Never    Smokeless tobacco: Never  Substance Use Topics   Alcohol use: No    Alcohol/week: 0.0 standard drinks of alcohol   Drug use: No   Family History  Problem Relation Age of Onset   Hypertension Father    No Known Allergies Current Outpatient Medications on File Prior to Visit  Medication Sig Dispense Refill   albuterol (VENTOLIN HFA) 108 (90 Base) MCG/ACT inhaler Inhale 2 puffs into the lungs every 4 (four) hours as needed for wheezing. 1 each 1   imipramine (TOFRANIL) 25 MG tablet Take 2 tablets (50 mg total) by mouth at bedtime. 180 tablet 3   lisinopril-hydrochlorothiazide (ZESTORETIC) 10-12.5 MG tablet Take 1 tablet by mouth daily. 90 tablet 3   promethazine-dextromethorphan (PROMETHAZINE-DM) 6.25-15 MG/5ML syrup Take 5 mLs by mouth 4 (four) times daily as needed for cough. 118 mL 0   No current facility-administered medications on file prior to visit.    Review of Systems  Constitutional:  Positive for fatigue. Negative for activity change, appetite change, fever and unexpected weight change.  HENT:  Positive for congestion. Negative for ear pain, rhinorrhea, sinus pressure and sore throat.  Some nasal congestion    Eyes:  Negative for pain, redness and visual disturbance.  Respiratory:  Positive for cough. Negative for chest tightness, shortness of breath, wheezing and stridor.   Cardiovascular:  Negative for chest pain and palpitations.  Gastrointestinal:  Negative for abdominal pain, blood in stool, constipation and diarrhea.  Endocrine: Negative for polydipsia and polyuria.  Genitourinary:  Negative for dysuria, frequency and urgency.  Musculoskeletal:  Negative for arthralgias, back pain and myalgias.  Skin:  Negative for pallor and rash.  Allergic/Immunologic: Negative for environmental allergies.  Neurological:  Negative for dizziness, syncope and headaches.  Hematological:  Negative for adenopathy. Does not bruise/bleed easily.  Psychiatric/Behavioral:  Negative  for decreased concentration and dysphoric mood. The patient is not nervous/anxious.        Objective:   Physical Exam Constitutional:      General: She is not in acute distress.    Appearance: Normal appearance. She is not ill-appearing or diaphoretic.  HENT:     Head: Atraumatic.     Right Ear: Tympanic membrane, ear canal and external ear normal.     Left Ear: Tympanic membrane, ear canal and external ear normal.     Nose:     Comments: Mildly congested boggy nares    Mouth/Throat:     Mouth: Mucous membranes are moist.     Pharynx: Oropharynx is clear. No posterior oropharyngeal erythema.  Eyes:     General:        Right eye: No discharge.        Left eye: No discharge.     Conjunctiva/sclera: Conjunctivae normal.     Pupils: Pupils are equal, round, and reactive to light.  Cardiovascular:     Rate and Rhythm: Normal rate and regular rhythm.     Heart sounds: Normal heart sounds.  Pulmonary:     Effort: No respiratory distress.     Breath sounds: Normal breath sounds. No stridor. No wheezing, rhonchi or rales.  Musculoskeletal:     Cervical back: Neck supple.  Lymphadenopathy:     Cervical: No cervical adenopathy.  Skin:    General: Skin is warm and dry.     Coloration: Skin is not pale.     Findings: No rash.     Comments: Solar lentigines diffusely   Neurological:     Mental Status: She is alert.  Psychiatric:        Mood and Affect: Mood normal.           Assessment & Plan:    Problem List Items Addressed This Visit       Respiratory   Pneumonia - Primary    RLL infiltrate, treated a month ago Continues to gradually improve Still some fatigue  Cough is improving (scant white phlegm) and no fever Recommend rest/fluids and gradually inc activity  cxr ordered today-pend rad rev  Expectorant prn  Update if not starting to improve in a week or if worsening  If cough continues-notable she is on ace as well       Relevant Orders   DG Chest 2 View      Other   Nasal congestion    Mild  In midst of recovery from pneumonia Recommend saline ns Can try flonase otc during allergy season also

## 2022-07-04 NOTE — Patient Instructions (Addendum)
Drink fluids  Take mucinex if you need it  Get rest  Gradually increase your activity as you feel better   Chest xray today   If symptoms worsen let us know   Saline nasal spray is great  You can add flonase daily in allergy season

## 2022-08-02 ENCOUNTER — Other Ambulatory Visit: Payer: Self-pay | Admitting: Family Medicine

## 2022-08-04 NOTE — Telephone Encounter (Signed)
F/u scheduled on 07/04/22, last filled on 07/30/21 #180 tabs with 3 refills

## 2022-08-29 ENCOUNTER — Other Ambulatory Visit: Payer: Self-pay | Admitting: Family Medicine

## 2022-08-29 NOTE — Telephone Encounter (Signed)
Please schedule a winter annual exam

## 2022-08-29 NOTE — Telephone Encounter (Signed)
Pt has had multiple acute appts but no recent f/u or CPE, please advise

## 2022-09-01 NOTE — Telephone Encounter (Signed)
Spoke to pt, scheduled cpe for 09/16/22

## 2022-09-08 ENCOUNTER — Telehealth: Payer: Self-pay | Admitting: Family Medicine

## 2022-09-08 DIAGNOSIS — I10 Essential (primary) hypertension: Secondary | ICD-10-CM

## 2022-09-08 DIAGNOSIS — E7800 Pure hypercholesterolemia, unspecified: Secondary | ICD-10-CM

## 2022-09-08 DIAGNOSIS — R7303 Prediabetes: Secondary | ICD-10-CM

## 2022-09-08 NOTE — Telephone Encounter (Signed)
-----   Message from Velna Hatchet, RT sent at 09/01/2022 11:25 AM EST ----- Regarding: Tue 12/12 lab Patient is scheduled for cpx, please order future labs.  Thanks, Anda Kraft

## 2022-09-09 ENCOUNTER — Other Ambulatory Visit (INDEPENDENT_AMBULATORY_CARE_PROVIDER_SITE_OTHER): Payer: 59

## 2022-09-09 DIAGNOSIS — E7800 Pure hypercholesterolemia, unspecified: Secondary | ICD-10-CM | POA: Diagnosis not present

## 2022-09-09 DIAGNOSIS — R7303 Prediabetes: Secondary | ICD-10-CM | POA: Diagnosis not present

## 2022-09-09 DIAGNOSIS — I10 Essential (primary) hypertension: Secondary | ICD-10-CM | POA: Diagnosis not present

## 2022-09-09 LAB — CBC WITH DIFFERENTIAL/PLATELET
Basophils Absolute: 0 10*3/uL (ref 0.0–0.1)
Basophils Relative: 0.5 % (ref 0.0–3.0)
Eosinophils Absolute: 0.2 10*3/uL (ref 0.0–0.7)
Eosinophils Relative: 2.3 % (ref 0.0–5.0)
HCT: 39.4 % (ref 36.0–46.0)
Hemoglobin: 13.6 g/dL (ref 12.0–15.0)
Lymphocytes Relative: 35.1 % (ref 12.0–46.0)
Lymphs Abs: 2.3 10*3/uL (ref 0.7–4.0)
MCHC: 34.5 g/dL (ref 30.0–36.0)
MCV: 89 fl (ref 78.0–100.0)
Monocytes Absolute: 0.6 10*3/uL (ref 0.1–1.0)
Monocytes Relative: 8.5 % (ref 3.0–12.0)
Neutro Abs: 3.5 10*3/uL (ref 1.4–7.7)
Neutrophils Relative %: 53.6 % (ref 43.0–77.0)
Platelets: 226 10*3/uL (ref 150.0–400.0)
RBC: 4.43 Mil/uL (ref 3.87–5.11)
RDW: 13.7 % (ref 11.5–15.5)
WBC: 6.5 10*3/uL (ref 4.0–10.5)

## 2022-09-09 LAB — COMPREHENSIVE METABOLIC PANEL
ALT: 45 U/L — ABNORMAL HIGH (ref 0–35)
AST: 36 U/L (ref 0–37)
Albumin: 4.5 g/dL (ref 3.5–5.2)
Alkaline Phosphatase: 77 U/L (ref 39–117)
BUN: 13 mg/dL (ref 6–23)
CO2: 31 mEq/L (ref 19–32)
Calcium: 9.8 mg/dL (ref 8.4–10.5)
Chloride: 100 mEq/L (ref 96–112)
Creatinine, Ser: 0.96 mg/dL (ref 0.40–1.20)
GFR: 63.53 mL/min (ref >60.00)
Glucose, Bld: 128 mg/dL — ABNORMAL HIGH (ref 70–99)
Potassium: 4.3 mEq/L (ref 3.5–5.1)
Sodium: 140 mEq/L (ref 135–145)
Total Bilirubin: 0.6 mg/dL (ref 0.2–1.2)
Total Protein: 7 g/dL (ref 6.0–8.3)

## 2022-09-09 LAB — LIPID PANEL
Cholesterol: 223 mg/dL — ABNORMAL HIGH (ref 0–200)
HDL: 42.9 mg/dL (ref >39.00)
LDL Cholesterol: 140 mg/dL — ABNORMAL HIGH (ref 0–99)
NonHDL: 180.2
Total CHOL/HDL Ratio: 5
Triglycerides: 199 mg/dL — ABNORMAL HIGH (ref 0.0–149.0)
VLDL: 39.8 mg/dL (ref 0.0–40.0)

## 2022-09-09 LAB — TSH: TSH: 3.06 u[IU]/mL (ref 0.35–5.50)

## 2022-09-09 LAB — HEMOGLOBIN A1C: Hgb A1c MFr Bld: 7.3 % — ABNORMAL HIGH (ref 4.6–6.5)

## 2022-09-16 ENCOUNTER — Encounter: Payer: 59 | Admitting: Family Medicine

## 2022-09-18 ENCOUNTER — Ambulatory Visit (INDEPENDENT_AMBULATORY_CARE_PROVIDER_SITE_OTHER): Payer: 59 | Admitting: Family Medicine

## 2022-09-18 ENCOUNTER — Encounter: Payer: Self-pay | Admitting: Family Medicine

## 2022-09-18 VITALS — BP 142/90 | HR 109 | Temp 98.2°F | Ht <= 58 in | Wt 174.5 lb

## 2022-09-18 DIAGNOSIS — Z23 Encounter for immunization: Secondary | ICD-10-CM

## 2022-09-18 DIAGNOSIS — I10 Essential (primary) hypertension: Secondary | ICD-10-CM

## 2022-09-18 DIAGNOSIS — Z1211 Encounter for screening for malignant neoplasm of colon: Secondary | ICD-10-CM

## 2022-09-18 DIAGNOSIS — Z6836 Body mass index (BMI) 36.0-36.9, adult: Secondary | ICD-10-CM

## 2022-09-18 DIAGNOSIS — E7800 Pure hypercholesterolemia, unspecified: Secondary | ICD-10-CM

## 2022-09-18 DIAGNOSIS — G43909 Migraine, unspecified, not intractable, without status migrainosus: Secondary | ICD-10-CM | POA: Diagnosis not present

## 2022-09-18 DIAGNOSIS — R7401 Elevation of levels of liver transaminase levels: Secondary | ICD-10-CM

## 2022-09-18 DIAGNOSIS — E119 Type 2 diabetes mellitus without complications: Secondary | ICD-10-CM

## 2022-09-18 DIAGNOSIS — Z0000 Encounter for general adult medical examination without abnormal findings: Secondary | ICD-10-CM

## 2022-09-18 MED ORDER — LOSARTAN POTASSIUM-HCTZ 50-12.5 MG PO TABS: 1.0000 | ORAL_TABLET | Freq: Every day | ORAL | 3 refills | Status: DC

## 2022-09-18 NOTE — Progress Notes (Signed)
Subjective:    Patient ID: Jo Lamb, female    DOB: [birth year], 61 y.o.   MRN: [id number]  HPI Here for health maintenance exam and to review chronic medical problems    Wt Readings from Last 3 Encounters:  09/18/22 174 lb 8 oz (79.2 kg)  07/04/22 173 lb 12.8 oz (78.8 kg)  06/13/22 170 lb 4 oz (77.2 kg)   37.11 kg/m  Not a lot of time for self care  Has not eaten well and numbers show it   Immunization History  Administered Date(s) Administered   Influenza Split 10/03/2011, 08/18/2012   Influenza,inj,Quad PF,6+ Mos 06/03/2013, 06/27/2015, 07/14/2016, 11/11/2017, 07/30/2021   Tdap 05/09/2014   Health Maintenance Due  Topic Date Due   MAMMOGRAM  Never done   Zoster Vaccines- Shingrix (1 of 2) Never done   COLONOSCOPY (Pts 45-38yr Insurance coverage will need to be confirmed)  03/29/2015   Mammogram: declines  Self breast exam: no lumps   Has not been to gyn- needs to make an appt   Shingrix: interested   She had pna in the fall and was treated Still a little cough The cxr cleared  Phlegm is a little white in the am   Colonoscopy 02/2005  Wants to schedule that   Trying to drink more water  Really dislikes drinking   Flu shot today    HTN bp is stable today  No cp or palpitations or headaches or edema  No side effects to medicines  BP Readings from Last 3 Encounters:  09/18/22 (!) 150/88  07/04/22 132/82  06/13/22 (!) 144/82     Lisinopril hct 10-12.5 mg daily  Took it today  Is good about that    H/o migraines Takes imipramine for prophylaxis  Better overall   Has pinched nerve in her neck that triggers occ  Does not tolerate gabapentin     H/o elevated transaminase in past   Lab Results  Component Value Date   ALT 45 (H) 09/09/2022   AST 36 09/09/2022   ALKPHOS 77 09/09/2022   BILITOT 0.6 09/09/2022   Hyperlipidemia Lab Results  Component Value Date   CHOL 223 (H) 09/09/2022   CHOL 220 (H) 07/30/2021   CHOL 236 (H)  07/02/2020   Lab Results  Component Value Date   HDL 42.90 09/09/2022   HDL 42.90 07/30/2021   HDL 39.40 07/02/2020   Lab Results  Component Value Date   LDLCALC 140 (H) 09/09/2022   LDLCALC 157 (H) 07/02/2020   LDLCALC 155 (H) 02/25/2019   Lab Results  Component Value Date   TRIG 199.0 (H) 09/09/2022   TRIG 206.0 (H) 07/30/2021   TRIG 198.0 (H) 07/02/2020   Lab Results  Component Value Date   CHOLHDL 5 09/09/2022   CHOLHDL 5 07/30/2021   CHOLHDL 6 07/02/2020   Lab Results  Component Value Date   LDLDIRECT 155.0 07/30/2021   LDLDIRECT 192.0 06/27/2015   LDLDIRECT 145.5 02/16/2013   Fair amt of fatty foods Eats out more than she should  Fatty breakfast meat    Prediabetes Lab Results  Component Value Date   HGBA1C 7.3 (H) 09/09/2022   This is up from 6.7   She is a sweet food and sweet tea addict   Some thirst and urination    Other labs Lab Results  Component Value Date   CREATININE 0.96 09/09/2022   BUN 13 09/09/2022   NA 140 09/09/2022   K 4.3 09/09/2022   CL  100 09/09/2022   CO2 31 09/09/2022   Lab Results  Component Value Date   WBC 6.5 09/09/2022   HGB 13.6 09/09/2022   HCT 39.4 09/09/2022   MCV 89.0 09/09/2022   PLT 226.0 09/09/2022   Lab Results  Component Value Date   TSH 3.06 09/09/2022   Patient Active Problem List   Diagnosis Date Noted   Colon cancer screening 09/18/2022   Nasal congestion 07/04/2022   H/O: pneumonia 06/03/2022   Irritant contact dermatitis 04/15/2022   Elevated transaminase level 02/16/2019   Routine general medical examination at a health care facility 05/01/2014   DM (diabetes mellitus), type 2 ([place]) 11/14/2012   BACK PAIN WITH RADICULOPATHY 11/29/2008   Essential hypertension 11/01/2008   Class 2 severe obesity due to excess calories with serious comorbidity and body mass index (BMI) of 36.0 to 36.9 in adult ([place]) 08/11/2008   GERD 08/11/2008   Hyperlipidemia 07/07/2008   NECK PAIN, CHRONIC  07/21/2007   Migraine headache 07/20/2007   IBS 07/20/2007   Past Medical History:  Diagnosis Date   Back pain    Carpal tunnel syndrome on both sides 02/2004// 10/22/2004   Nerve conduction study x's 2   Colon polyps    colonoscopy- polyps, hemorrhoids, divertic   Fibroids    History of migraine headaches    Hypertension    White coat   IBS (irritable bowel syndrome)    Neck pain    Past Surgical History:  Procedure Laterality Date   MRI     MRI of head// Negative   TUBAL LIGATION     VAGINAL DELIVERY     x's two   Social History   Tobacco Use   Smoking status: Never   Smokeless tobacco: Never  Substance Use Topics   Alcohol use: No    Alcohol/week: 0.0 standard drinks of alcohol   Drug use: No   Family History  Problem Relation Age of Onset   Hypertension Father    No Known Allergies Current Outpatient Medications on File Prior to Visit  Medication Sig Dispense Refill   imipramine (TOFRANIL) 25 MG tablet TAKE 2 TABLETS BY MOUTH AT BEDTIME. 180 tablet 3   No current facility-administered medications on file prior to visit.      Review of Systems  Constitutional:  Positive for fatigue. Negative for activity change, appetite change, fever and unexpected weight change.  HENT:  Negative for congestion, ear pain, rhinorrhea, sinus pressure and sore throat.   Eyes:  Negative for pain, redness and visual disturbance.  Respiratory:  Negative for cough, shortness of breath and wheezing.   Cardiovascular:  Negative for chest pain and palpitations.  Gastrointestinal:  Negative for abdominal pain, blood in stool, constipation and diarrhea.  Endocrine: Positive for polyuria. Negative for polydipsia.  Genitourinary:  Negative for dysuria, frequency and urgency.  Musculoskeletal:  Negative for arthralgias, back pain and myalgias.  Skin:  Negative for pallor and rash.  Allergic/Immunologic: Negative for environmental allergies.  Neurological:  Negative for dizziness,  syncope and headaches.  Hematological:  Negative for adenopathy. Does not bruise/bleed easily.  Psychiatric/Behavioral:  Negative for decreased concentration and dysphoric mood. The patient is not nervous/anxious.        Stressors       Objective:   Physical Exam Constitutional:      General: She is not in acute distress.    Appearance: Normal appearance. She is well-developed. She is obese. She is not ill-appearing or diaphoretic.  HENT:  Head: Normocephalic and atraumatic.     Right Ear: Tympanic membrane, ear canal and external ear normal.     Left Ear: Tympanic membrane, ear canal and external ear normal.     Nose: Nose normal. No congestion.     Mouth/Throat:     Mouth: Mucous membranes are moist.     Pharynx: Oropharynx is clear. No posterior oropharyngeal erythema.  Eyes:     General: No scleral icterus.    Extraocular Movements: Extraocular movements intact.     Conjunctiva/sclera: Conjunctivae normal.     Pupils: Pupils are equal, round, and reactive to light.  Neck:     Thyroid: No thyromegaly.     Vascular: No carotid bruit or JVD.  Cardiovascular:     Rate and Rhythm: Normal rate and regular rhythm.     Pulses: Normal pulses.     Heart sounds: Normal heart sounds.     No gallop.  Pulmonary:     Effort: Pulmonary effort is normal. No respiratory distress.     Breath sounds: Normal breath sounds. No wheezing.     Comments: Good air exch Chest:     Chest wall: No tenderness.  Abdominal:     General: Bowel sounds are normal. There is no distension or abdominal bruit.     Palpations: Abdomen is soft. There is no mass.     Tenderness: There is no abdominal tenderness.     Hernia: No hernia is present.  Genitourinary:    Comments: Breast exam: No mass, nodules, thickening, tenderness, bulging, retraction, inflamation, nipple discharge or skin changes noted.  No axillary or clavicular LA.     Musculoskeletal:        General: No tenderness. Normal range of motion.      Cervical back: Normal range of motion and neck supple. No rigidity. No muscular tenderness.     Right lower leg: No edema.     Left lower leg: No edema.     Comments: No kyphosis   Lymphadenopathy:     Cervical: No cervical adenopathy.  Skin:    General: Skin is warm and dry.     Coloration: Skin is not pale.     Findings: No erythema or rash.     Comments: Solar lentigines diffusely   Neurological:     Mental Status: She is alert. Mental status is at baseline.     Cranial Nerves: No cranial nerve deficit.     Motor: No abnormal muscle tone.     Coordination: Coordination normal.     Gait: Gait normal.     Deep Tendon Reflexes: Reflexes are normal and symmetric. Reflexes normal.  Psychiatric:        Mood and Affect: Mood normal.        Cognition and Memory: Cognition and memory normal.           Assessment & Plan:   Problem List Items Addressed This Visit       Cardiovascular and Mediastinum   Essential hypertension    bp in fair (not opt) control at this time  BP Readings from Last 1 Encounters:  09/18/22 (!) 142/90   Most recent labs reviewed  Disc lifstyle change with low sodium diet and exercise  lisinopril hct 10-12.5 mg daily may be worsening cough  Will change to losartan hct 50-12.5 mg daily and f/u in 3 mo for this and new dm        Relevant Medications   losartan-hydrochlorothiazide (HYZAAR) 50-12.5 MG tablet  Migraine headache    Imipramine continues to help Will continue this       Relevant Medications   losartan-hydrochlorothiazide (HYZAAR) 50-12.5 MG tablet     Endocrine   DM (diabetes mellitus), type 2 ([facility])    New Lab Results  Component Value Date   HGBA1C 7.3 (H) 09/09/2022  Lost to f/u after last A1c of 6.7   She is getting motivated for change Disc low glycemic diet/wt loss goals as well as exercise  First will stop sugar drinks and most of the sweets  Then work on carbs Disc imp of water intake F/u 3 mo  Will likely  start metformin  Ace changed to arb today  Nl foot exam Enc eye exams Will likely start statin next time        Relevant Medications   losartan-hydrochlorothiazide (HYZAAR) 50-12.5 MG tablet     Other   Class 2 severe obesity due to excess calories with serious comorbidity and body mass index (BMI) of 36.0 to 36.9 in adult [facility])    Now diabetic and bp is up  Discussed how this problem influences overall health and the risks it imposes  Reviewed plan for weight loss with lower calorie diet (via better food choices and also portion control or program like weight watchers) and exercise building up to or more than 30 minutes 5 days per week including some aerobic activity        Colon cancer screening    Due for screening colonoscopy GI ref done She will call to schedule      Relevant Orders   Ambulatory referral to Gastroenterology   Elevated transaminase level    Improved AST of 45 now  Enc wt loss/ this may be fatty liver  Disc further at 3 mo f/u      Hyperlipidemia    Disc goals for lipids and reasons to control them Rev last labs with pt Rev low sat fat diet in detail LDL is down to 140  With new dm will need to consider statin   F/u 3 mo to discuss further       Relevant Medications   losartan-hydrochlorothiazide (HYZAAR) 50-12.5 MG tablet   Routine general medical examination at a health care facility - Primary    Reviewed health habits including diet and exercise and skin cancer prevention Reviewed appropriate screening tests for age  Also reviewed health mt list, fam hx and immunization status , as well as social and family history   See HPI Labs reviewed Declines mammogram  Interested in shingrix, she will check on coverage Flu shot given  Wants to get pna vaccine in the future but not today  Referral done for GI  to get colonoscopy for screening, she will call to schedule  Enc better diet/exercise and water intake       Relevant Orders   Flu  Vaccine QUAD 6+ mos PF IM (Fluarix Quad PF) (Completed)   Other Visit Diagnoses     Need for influenza vaccination       Relevant Orders   Flu Vaccine QUAD 6+ mos PF IM (Fluarix Quad PF) (Completed)

## 2022-09-18 NOTE — Assessment & Plan Note (Signed)
Improved AST of 45 now  Enc wt loss/ this may be fatty liver  Disc further at 3 mo f/u

## 2022-09-18 NOTE — Assessment & Plan Note (Addendum)
bp in fair (not opt) control at this time  BP Readings from Last 1 Encounters:  09/18/22 (!) 142/90    Most recent labs reviewed  Disc lifstyle change with low sodium diet and exercise  lisinopril hct 10-12.5 mg daily may be worsening cough  Will change to losartan hct 50-12.5 mg daily and f/u in 3 mo for this and new dm

## 2022-09-18 NOTE — Assessment & Plan Note (Signed)
Now diabetic and bp is up  Discussed how this problem influences overall health and the risks it imposes  Reviewed plan for weight loss with lower calorie diet (via better food choices and also portion control or program like weight watchers) and exercise building up to or more than 30 minutes 5 days per week including some aerobic activity

## 2022-09-18 NOTE — Assessment & Plan Note (Signed)
Disc goals for lipids and reasons to control them Rev last labs with pt Rev low sat fat diet in detail LDL is down to 140  With new dm will need to consider statin   F/u 3 mo to discuss further

## 2022-09-18 NOTE — Assessment & Plan Note (Signed)
Imipramine continues to help Will continue this

## 2022-09-18 NOTE — Patient Instructions (Addendum)
Set up your gyn follow up appt   If you are interested in the shingles vaccine series (Shingrix), call your insurance or pharmacy to check on coverage and location it must be given.  If affordable - you can schedule it here or at your pharmacy depending on coverage   Please make an effort to drink fluids   I placed a GI referral for colonoscopy  Please call [facility] to schedule  [facility]  [phone]   Flu shot today  Let's consider pneumonia shot later   For diabetes  Try to get most of your carbohydrates from produce (with the exception of white potatoes)  Eat less bread/pasta/rice/snack foods/cereals/sweets and other items from the middle of the grocery store (processed carbs)  Stop the lisinopril hct -it may be perpetuating the cough  Start losartan hct instead   Follow up in 3 months for diabetes

## 2022-09-18 NOTE — Assessment & Plan Note (Signed)
Due for screening colonoscopy GI ref done She will call to schedule

## 2022-09-18 NOTE — Assessment & Plan Note (Signed)
Reviewed health habits including diet and exercise and skin cancer prevention Reviewed appropriate screening tests for age  Also reviewed health mt list, fam hx and immunization status , as well as social and family history   See HPI Labs reviewed Declines mammogram  Interested in shingrix, she will check on coverage Flu shot given  Wants to get pna vaccine in the future but not today  Referral done for GI  to get colonoscopy for screening, she will call to schedule  Enc better diet/exercise and water intake

## 2022-09-18 NOTE — Assessment & Plan Note (Signed)
New Lab Results  Component Value Date   HGBA1C 7.3 (H) 09/09/2022   Lost to f/u after last A1c of 6.7   She is getting motivated for change Disc low glycemic diet/wt loss goals as well as exercise  First will stop sugar drinks and most of the sweets  Then work on carbs Disc imp of water intake F/u 3 mo  Will likely start metformin  Ace changed to arb today  Nl foot exam Enc eye exams Will likely start statin next time

## 2022-10-06 ENCOUNTER — Ambulatory Visit: Payer: 59 | Admitting: Dermatology

## 2023-01-05 ENCOUNTER — Ambulatory Visit: Payer: 59 | Admitting: Family Medicine

## 2023-01-05 VITALS — BP 132/80 | HR 93 | Temp 98.2°F | Ht <= 58 in | Wt 158.5 lb

## 2023-01-05 DIAGNOSIS — I10 Essential (primary) hypertension: Secondary | ICD-10-CM | POA: Diagnosis not present

## 2023-01-05 DIAGNOSIS — E119 Type 2 diabetes mellitus without complications: Secondary | ICD-10-CM

## 2023-01-05 DIAGNOSIS — R7401 Elevation of levels of liver transaminase levels: Secondary | ICD-10-CM

## 2023-01-05 DIAGNOSIS — E7800 Pure hypercholesterolemia, unspecified: Secondary | ICD-10-CM

## 2023-01-05 DIAGNOSIS — Z6833 Body mass index (BMI) 33.0-33.9, adult: Secondary | ICD-10-CM

## 2023-01-05 DIAGNOSIS — R7303 Prediabetes: Secondary | ICD-10-CM | POA: Diagnosis not present

## 2023-01-05 DIAGNOSIS — E6609 Other obesity due to excess calories: Secondary | ICD-10-CM

## 2023-01-05 LAB — BASIC METABOLIC PANEL
BUN: 15 mg/dL (ref 6–23)
CO2: 30 mEq/L (ref 19–32)
Calcium: 9.9 mg/dL (ref 8.4–10.5)
Chloride: 101 mEq/L (ref 96–112)
Creatinine, Ser: 0.97 mg/dL (ref 0.40–1.20)
GFR: 62.6 mL/min (ref >60.00)
Glucose, Bld: 101 mg/dL — ABNORMAL HIGH (ref 70–99)
Potassium: 4.2 mEq/L (ref 3.5–5.1)
Sodium: 140 mEq/L (ref 135–145)

## 2023-01-05 LAB — LIPID PANEL
Cholesterol: 234 mg/dL — ABNORMAL HIGH (ref 0–200)
HDL: 41.7 mg/dL (ref >39.00)
LDL Cholesterol: 162 mg/dL — ABNORMAL HIGH (ref 0–99)
NonHDL: 192.36
Total CHOL/HDL Ratio: 6
Triglycerides: 151 mg/dL — ABNORMAL HIGH (ref 0.0–149.0)
VLDL: 30.2 mg/dL (ref 0.0–40.0)

## 2023-01-05 LAB — HEPATIC FUNCTION PANEL
ALT: 27 U/L (ref 0–35)
AST: 22 U/L (ref 0–37)
Albumin: 4.7 g/dL (ref 3.5–5.2)
Alkaline Phosphatase: 74 U/L (ref 39–117)
Bilirubin, Direct: 0.1 mg/dL (ref 0.0–0.3)
Total Bilirubin: 0.5 mg/dL (ref 0.2–1.2)
Total Protein: 6.9 g/dL (ref 6.0–8.3)

## 2023-01-05 LAB — POCT GLYCOSYLATED HEMOGLOBIN (HGB A1C): Hemoglobin A1C: 5.9 % — AB (ref 4.0–5.6)

## 2023-01-05 NOTE — Assessment & Plan Note (Signed)
A1c is no longer in dm range Disc goals for lipids and reasons to control them Rev last labs with pt Rev low sat fat diet in detail  Eating better  Re check today

## 2023-01-05 NOTE — Progress Notes (Signed)
Subjective:    Patient ID: Jo Lamb, female    DOB: [birth year], 62 y.o.   MRN: [id number]  HPI Pt presents for f/u of DM2 and HTN and chronic medical problems  Wt Readings from Last 3 Encounters:  01/05/23 158 lb 8 oz (71.9 kg)  09/18/22 174 lb 8 oz (79.2 kg)  07/04/22 173 lb 12.8 oz (78.8 kg)   33.71 kg/m  Really working on weight loss   Using myfitness pal app Drinking more water  Walks at work during the day when she can     Vitals:   01/05/23 0958 01/05/23 1017  BP: (!) 142/78 132/80  Pulse: 93   Temp: 98.2 F (36.8 C)   SpO2: 97%       HTN BP Readings from Last 3 Encounters:  01/05/23 132/80  09/18/22 (!) 142/90  07/04/22 132/82    Losartan hct hct 10-12.5 mg daily  Not coughing   Makes her urinate more at night    Lab Results  Component Value Date   CREATININE 0.96 09/09/2022   BUN 13 09/09/2022   NA 140 09/09/2022   K 4.3 09/09/2022   CL 100 09/09/2022   CO2 31 09/09/2022     DM2   last time A1c went up significantly  Lab Results  Component Value Date   HGBA1C 7.3 (H) 09/09/2022    We discussed lifestyle change   Much improved today ! 5.9     Last eye exam   Diet change Has backed off of carbs/sweets  No sweet tea (occ art sweetener)   Feels much better overall   Taking some vitamins    Cholesterol Lab Results  Component Value Date   CHOL 223 (H) 09/09/2022   HDL 42.90 09/09/2022   LDLCALC 140 (H) 09/09/2022   LDLDIRECT 155.0 07/30/2021   TRIG 199.0 (H) 09/09/2022   CHOLHDL 5 09/09/2022   Not on a statin yet   Lab Results  Component Value Date   ALT 45 (H) 09/09/2022   AST 36 09/09/2022   ALKPHOS 77 09/09/2022   BILITOT 0.6 09/09/2022   Patient Active Problem List   Diagnosis Date Noted   Colon cancer screening 09/18/2022   H/O: pneumonia 06/03/2022   Elevated transaminase level 02/16/2019   Routine general medical examination at a health care facility 05/01/2014   Prediabetes 11/14/2012    BACK PAIN WITH RADICULOPATHY 11/29/2008   Essential hypertension 11/01/2008   Class 1 obesity due to excess calories with body mass index (BMI) of 33.0 to 33.9 in adult 08/11/2008   GERD 08/11/2008   Hyperlipidemia 07/07/2008   NECK PAIN, CHRONIC 07/21/2007   Migraine headache 07/20/2007   IBS 07/20/2007   Past Medical History:  Diagnosis Date   Back pain    Carpal tunnel syndrome on both sides 02/2004// 10/22/2004   Nerve conduction study x's 2   Colon polyps    colonoscopy- polyps, hemorrhoids, divertic   Fibroids    History of migraine headaches    Hypertension    White coat   IBS (irritable bowel syndrome)    Neck pain    Past Surgical History:  Procedure Laterality Date   MRI     MRI of head// Negative   TUBAL LIGATION     VAGINAL DELIVERY     x's two   Social History   Tobacco Use   Smoking status: Never   Smokeless tobacco: Never  Substance Use Topics   Alcohol use: No  Alcohol/week: 0.0 standard drinks of alcohol   Drug use: No   Family History  Problem Relation Age of Onset   Hypertension Father    No Known Allergies Current Outpatient Medications on File Prior to Visit  Medication Sig Dispense Refill   imipramine (TOFRANIL) 25 MG tablet TAKE 2 TABLETS BY MOUTH AT BEDTIME. 180 tablet 3   ketoconazole (NIZORAL) 2 % shampoo Apply 1 Application topically. 2-3x weekly     losartan-hydrochlorothiazide (HYZAAR) 50-12.5 MG tablet Take 1 tablet by mouth daily. 90 tablet 3   No current facility-administered medications on file prior to visit.     Review of Systems  Constitutional:  Negative for activity change, appetite change, fatigue, fever and unexpected weight change.  HENT:  Negative for congestion, ear pain, rhinorrhea, sinus pressure and sore throat.   Eyes:  Negative for pain, redness and visual disturbance.  Respiratory:  Negative for cough, shortness of breath and wheezing.   Cardiovascular:  Negative for chest pain and palpitations.   Gastrointestinal:  Negative for abdominal pain, blood in stool, constipation and diarrhea.  Endocrine: Negative for polydipsia and polyuria.  Genitourinary:  Negative for dysuria, frequency and urgency.       More urination at night recently   Musculoskeletal:  Negative for arthralgias, back pain and myalgias.  Skin:  Negative for pallor and rash.  Allergic/Immunologic: Negative for environmental allergies.  Neurological:  Negative for dizziness, syncope and headaches.  Hematological:  Negative for adenopathy. Does not bruise/bleed easily.  Psychiatric/Behavioral:  Negative for decreased concentration and dysphoric mood. The patient is not nervous/anxious.        Objective:   Physical Exam Constitutional:      General: She is not in acute distress.    Appearance: Normal appearance. She is well-developed. She is not ill-appearing or diaphoretic.  HENT:     Head: Normocephalic and atraumatic.  Eyes:     Conjunctiva/sclera: Conjunctivae normal.     Pupils: Pupils are equal, round, and reactive to light.  Neck:     Thyroid: No thyromegaly.     Vascular: No carotid bruit or JVD.  Cardiovascular:     Rate and Rhythm: Normal rate and regular rhythm.     Heart sounds: Normal heart sounds.     No gallop.  Pulmonary:     Effort: Pulmonary effort is normal. No respiratory distress.     Breath sounds: Normal breath sounds. No wheezing or rales.  Abdominal:     General: There is no distension or abdominal bruit.     Palpations: Abdomen is soft.  Musculoskeletal:     Cervical back: Normal range of motion and neck supple.     Right lower leg: No edema.     Left lower leg: No edema.  Lymphadenopathy:     Cervical: No cervical adenopathy.  Skin:    General: Skin is warm and dry.     Coloration: Skin is not pale.     Findings: No rash.  Neurological:     Mental Status: She is alert.     Coordination: Coordination normal.     Deep Tendon Reflexes: Reflexes are normal and symmetric.  Reflexes normal.  Psychiatric:        Mood and Affect: Mood normal.           Assessment & Plan:   Problem List Items Addressed This Visit       Cardiovascular and Mediastinum   Essential hypertension    bp in fair control at this  time  BP Readings from Last 1 Encounters:  01/05/23 132/80  No changes needed Most recent labs reviewed  Disc lifstyle change with low sodium diet and exercise  Taking Losartan hct 50-12.5 mg daily  Thinks this makes her urinate more than the lisinopril hct Lab today  Has lost wt with lifestyle change-commended         Other   Class 1 obesity due to excess calories with body mass index (BMI) of 33.0 to 33.9 in adult    Commended on great wt loss so far  Wt is down from 174 to 158 lb and feels better Using myfitness pal app- could go up on calories at this point  Enc to keep walking  Add strength training -to help further with metabolic rate   Disc low glycemic diet goals A1c is down to 5.9       Elevated transaminase level    Pt has lost significant wt with lifestyle change Lab today      Relevant Orders   Hepatic function panel   Hyperlipidemia    A1c is no longer in dm range Disc goals for lipids and reasons to control them Rev last labs with pt Rev low sat fat diet in detail  Eating better  Re check today       Relevant Orders   Basic metabolic panel   Lipid panel   Hepatic function panel   Prediabetes - Primary    Lab Results  Component Value Date   HGBA1C 5.9 (A) 01/05/2023  Much improved with diet and exercise and wt loss   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

## 2023-01-05 NOTE — Assessment & Plan Note (Signed)
bp in fair control at this time  BP Readings from Last 1 Encounters:  01/05/23 132/80   No changes needed Most recent labs reviewed  Disc lifstyle change with low sodium diet and exercise  Taking Losartan hct 50-12.5 mg daily  Thinks this makes her urinate more than the lisinopril hct Lab today  Has lost wt with lifestyle change-commended

## 2023-01-05 NOTE — Assessment & Plan Note (Signed)
Commended on great wt loss so far  Wt is down from 174 to 158 lb and feels better Using myfitness pal app- could go up on calories at this point  Enc to keep walking  Add strength training -to help further with metabolic rate   Disc low glycemic diet goals A1c is down to 5.9

## 2023-01-05 NOTE — Patient Instructions (Addendum)
Check bp at home Relaxed Arm at heart level   Goal under 140 top and 90 bottom   Let us know if readings are above the goal   Keep walking  Add some strength training to your routine, this is important for bone and brain health and can reduce your risk of falls and help your body use insulin properly and regulate weight  Light weights, exercise bands , and internet videos are a good way to start  Yoga (chair or regular), machines , floor exercises or a gym with machines are also good options   Labs today

## 2023-01-05 NOTE — Assessment & Plan Note (Signed)
Pt has lost significant wt with lifestyle change Lab today

## 2023-01-05 NOTE — Assessment & Plan Note (Signed)
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

## 2023-01-06 ENCOUNTER — Telehealth: Payer: Self-pay | Admitting: *Deleted

## 2023-01-06 NOTE — Telephone Encounter (Signed)
-----   Message from Judy Pimple, MD sent at 01/05/2023  7:47 PM EDT ----- Liver tests are better Normal chemistry labs   Despite great diet your cholesterol is up (LDL is up to 162)  This may be genetic and you have reached the age for it to rise  I recommend trial of low dose crestor to get this down and help reduce vascular risks Let me know if you are open to this Avoid red meat/ fried foods/ egg yolks/ fatty breakfast meats/ butter, cheese and high fat dairy/ and shellfish

## 2023-01-06 NOTE — Telephone Encounter (Signed)
Left VM requesting pt to call the office back 

## 2023-01-09 NOTE — Telephone Encounter (Signed)
Patient called in returning call she received. 

## 2023-01-09 NOTE — Telephone Encounter (Signed)
Called patient reviewed all information and repeated back to me. Will call if any questions.  She declines medication at this time reviewed diet recommendations and will start working on that.

## 2023-04-16 ENCOUNTER — Encounter: Payer: Self-pay | Admitting: Internal Medicine

## 2023-04-17 ENCOUNTER — Telehealth: Payer: Self-pay | Admitting: Family Medicine

## 2023-04-17 ENCOUNTER — Ambulatory Visit: Payer: 59 | Admitting: Nurse Practitioner

## 2023-04-17 NOTE — Telephone Encounter (Signed)
FYI: This call has been transferred to Access Nurse. Once the result note has been entered staff can address the message at that time.  Patient called in with the following symptoms:  Red Word:blood in urine Patient called in and stated that she has blood in her urine. She stated that its clots.   Please advise at Mobile [phone] (mobile)  Message is routed to Provider Pool and [facility] Triage

## 2023-04-17 NOTE — Telephone Encounter (Signed)
Aware, will watch for correspondence  

## 2023-04-17 NOTE — Telephone Encounter (Signed)
Unable to attach Access nurse note due to computer downtime.  Pt is currently at [facility] ED.

## 2023-04-20 ENCOUNTER — Telehealth: Payer: Self-pay

## 2023-04-20 NOTE — Telephone Encounter (Signed)
Per chart review patient was seen in ED

## 2023-04-20 NOTE — Telephone Encounter (Signed)
Opened in error    Woodfin Ganja LPN [facility] Nurse Health Advisor Direct Dial [phone]

## 2023-05-11 ENCOUNTER — Encounter: Payer: Self-pay | Admitting: Internal Medicine

## 2023-05-11 ENCOUNTER — Ambulatory Visit (AMBULATORY_SURGERY_CENTER): Payer: 59 | Admitting: *Deleted

## 2023-05-11 VITALS — Ht 58.5 in | Wt 163.0 lb

## 2023-05-11 DIAGNOSIS — Z860100 Personal history of colon polyps, unspecified: Secondary | ICD-10-CM

## 2023-05-11 DIAGNOSIS — Z1211 Encounter for screening for malignant neoplasm of colon: Secondary | ICD-10-CM

## 2023-05-11 NOTE — Progress Notes (Signed)
Pt's name and DOB verified at the beginning of the pre-visit.  Pt denies any difficulty with ambulating,sitting, laying down or rolling side to side Gave both [facility] main # and MD on call # prior to instructions.  No egg or soy allergy known to patient  No issues known to pt with past sedation with any surgeries or procedures Pt denies having issues being intubated Pt has no issues moving head neck or swallowing No FH of Malignant Hyperthermia Pt is not on diet pills Pt is not on home 02  Pt is not on blood thinners  Pt denies issues with constipation  Pt has frequent issues with constipation RN instructed pt to use Miralax per bottles instructions a week before prep days. Pt states they will Pt is not on dialysis Pt denise any abnormal heart rhythms  Pt denies any upcoming cardiac testing Pt encouraged to use to use Singlecare or Goodrx to reduce cost  Patient's chart reviewed by Jo Lamb CNRA prior to pre-visit and patient appropriate for the [facility].  Pre-visit completed and red dot placed by patient's name on their procedure day (on provider's schedule).  . Visit by phone Pt states weight is 163 lb Instructed pt why it is important to and  to call if they have any changes in health or new medications. Directed them to the # given and on instructions.   Pt states they will.  Instructions reviewed with pt and pt states understanding. Instructed to review again prior to procedure. Pt states they will.  Instructions sent by mail with coupon and by my chart

## 2023-05-28 NOTE — Progress Notes (Signed)
[facility] History and Physical   Primary Care Physician:  Tower, Audrie Gallus, MD   Reason for Procedure:   Hx colon polyp  Plan:    colonoscopy     HPI: Jo Lamb is a 62 y.o. female w/ prior colon adenoma (diminutive) removed at last colonoscopy 2006. Also in ED 7/24 with rectal bleeding, [facility]   Past Medical History:  Diagnosis Date   Back pain    Carpal tunnel syndrome on both sides 02/2004// 10/22/2004   Nerve conduction study x's 2   Colon polyps    colonoscopy- polyps, hemorrhoids, divertic   Diabetes mellitus without complication (HCC)    pre-diabetic   Fibroids    History of migraine headaches    Hypertension    White coat   IBS (irritable bowel syndrome)    Neck pain     Past Surgical History:  Procedure Laterality Date   MRI     MRI of head// Negative   TUBAL LIGATION     VAGINAL DELIVERY     x's two    Prior to Admission medications   Medication Sig Start Date End Date Taking? Authorizing Provider  imipramine (TOFRANIL) 25 MG tablet TAKE 2 TABLETS BY MOUTH AT BEDTIME. 08/04/22   Tower, Audrie Gallus, MD  ketoconazole (NIZORAL) 2 % shampoo Apply 1 Application topically. 2-3x weekly 12/25/22   [provider]  lactase (LACTAID) 3000 units tablet Take by mouth 3 (three) times daily with meals.    [provider]  losartan-hydrochlorothiazide (HYZAAR) 50-12.5 MG tablet Take 1 tablet by mouth daily. 09/18/22   Tower, Audrie Gallus, MD  OVER THE COUNTER MEDICATION Multivitamin with  fish oil  and milk thistle Tumeric grape seed pine bark etc    [provider]  Turmeric (QC TUMERIC COMPLEX PO) Take by mouth.    [provider]    Current Outpatient Medications  Medication Sig Dispense Refill   Fluocinolone Acetonide 0.01 % OIL SMARTSIG:1 Drop(s) In Ear(s) Daily PRN     imipramine (TOFRANIL) 25 MG tablet TAKE 2 TABLETS BY MOUTH AT BEDTIME. 180 tablet 3   ketoconazole (NIZORAL) 2 % shampoo Apply 1 Application  topically. 2-3x weekly     losartan-hydrochlorothiazide (HYZAAR) 50-12.5 MG tablet Take 1 tablet by mouth daily. 90 tablet 3   OVER THE COUNTER MEDICATION Multivitamin with  fish oil  and milk thistle Tumeric grape seed pine bark etc     Turmeric (QC TUMERIC COMPLEX PO) Take by mouth.     Current Facility-Administered Medications  Medication Dose Route Frequency Provider Last Rate Last Admin   0.9 %  sodium chloride infusion  500 mL Intravenous Continuous Iva Boop, MD        Allergies as of 05/29/2023   (No Known Allergies)    Family History  Problem Relation Age of Onset   Hypertension Father    Colon polyps Neg Hx    Colon cancer Neg Hx    Esophageal cancer Neg Hx    Rectal cancer Neg Hx    Stomach cancer Neg Hx     Social History   Socioeconomic History   Marital status: Married    Spouse name: Not on file   Number of children: Not on file   Years of education: Not on file   Highest education level: 12th grade  Occupational History   Not on file  Tobacco Use   Smoking status: Never   Smokeless tobacco: Never  Vaping Use   Vaping  status: Not on file  Substance and Sexual Activity   Alcohol use: No    Alcohol/week: 0.0 standard drinks of alcohol   Drug use: No   Sexual activity: Not on file  Other Topics Concern   Not on file  Social History Narrative   Occasionally caffeine- nor more than 1 serv/ day   Social Determinants of Health   Financial Resource Strain: Low Risk  (01/05/2023)   Overall Financial Resource Strain (CARDIA)    Difficulty of Paying Living Expenses: Not hard at all  Food Insecurity: No Food Insecurity (01/05/2023)   Hunger Vital Sign    Worried About Running Out of Food in the Last Year: Never true    Ran Out of Food in the Last Year: Never true  Transportation Needs: No Transportation Needs (01/05/2023)   PRAPARE - Administrator, Civil Service (Medical): No    Lack of Transportation (Non-Medical): No  Physical Activity:  Insufficiently Active (01/05/2023)   Exercise Vital Sign    Days of Exercise per Week: 4 days    Minutes of Exercise per Session: 20 min  Stress: No Stress Concern Present (01/05/2023)   [employer] of Occupational Health - Occupational Stress Questionnaire    Feeling of Stress : Only a little  Social Connections: Moderately Integrated (01/05/2023)   Social Connection and Isolation Panel [NHANES]    Frequency of Communication with Friends and Family: More than three times a week    Frequency of Social Gatherings with Friends and Family: Three times a week    Attends Religious Services: More than 4 times per year    Active Member of Clubs or Organizations: No    Attends Engineer, structural: Not on file    Marital Status: Married  Catering manager Violence: Not on file    Review of Systems:  All other review of systems negative except as mentioned in the HPI.  Physical Exam: Vital signs BP 134/77   Pulse 72   Temp 98.4 F (36.9 C) (Skin)   Ht 4' 10.5" (1.486 m)   Wt 163 lb (73.9 kg)   LMP 12/29/2010   SpO2 98%   BMI 33.49 kg/m   General:   Alert,  Well-developed, well-nourished, pleasant and cooperative in NAD Lungs:  Clear throughout to auscultation.   Heart:  Regular rate and rhythm; no murmurs, clicks, rubs,  or gallops. Abdomen:  Soft, nontender and nondistended. Normal bowel sounds.   Neuro/Psych:  Alert and cooperative. Normal mood and affect. A and O x 3   @Zeya Balles  Sena Slate, MD, [facility] [phone] (pager) 05/29/2023 1:37 PM@

## 2023-05-29 ENCOUNTER — Ambulatory Visit (AMBULATORY_SURGERY_CENTER): Payer: 59 | Admitting: Internal Medicine

## 2023-05-29 ENCOUNTER — Encounter: Payer: Self-pay | Admitting: Internal Medicine

## 2023-05-29 VITALS — BP 124/70 | HR 64 | Temp 98.4°F | Resp 16 | Ht 58.5 in | Wt 163.0 lb

## 2023-05-29 DIAGNOSIS — Z86010 Personal history of colonic polyps: Secondary | ICD-10-CM

## 2023-05-29 DIAGNOSIS — Z09 Encounter for follow-up examination after completed treatment for conditions other than malignant neoplasm: Secondary | ICD-10-CM

## 2023-05-29 MED ORDER — SODIUM CHLORIDE 0.9 % IV SOLN: 500.0000 mL | INTRAVENOUS | Status: DC

## 2023-05-29 NOTE — Progress Notes (Signed)
Patient states there have been no changes to medical or surgical history since time of pre-visit. 

## 2023-05-29 NOTE — Progress Notes (Signed)
Uneventful anesthetic. Report to pacu rn. Vss. Care resumed by rn. 

## 2023-05-29 NOTE — Op Note (Signed)
[facility] Patient Name: Jo Lamb Procedure Date: 05/29/2023 1:40 PM MRN: [id number] Endoscopist: Iva Boop , MD, [id number] Age: 62 Referring MD:  Date of Birth: [birth year] Gender: Female Account #: [id number] Procedure:                Colonoscopy Indications:              Surveillance: Personal history of adenomatous                            polyps on last colonoscopy > 5 years ago Medicines:                Monitored Anesthesia Care Procedure:                Pre-Anesthesia Assessment:                           - Prior to the procedure, a History and Physical                            was performed, and patient medications and                            allergies were reviewed. The patient's tolerance of                            previous anesthesia was also reviewed. The risks                            and benefits of the procedure and the sedation                            options and risks were discussed with the patient.                            All questions were answered, and informed consent                            was obtained. Prior Anticoagulants: The patient has                            taken no anticoagulant or antiplatelet agents. ASA                            Grade Assessment: II - A patient with mild systemic                            disease. After reviewing the risks and benefits,                            the patient was deemed in satisfactory condition to                            undergo the procedure.  After obtaining informed consent, the colonoscope                            was passed under direct vision. Throughout the                            procedure, the patient's blood pressure, pulse, and                            oxygen saturations were monitored continuously. The                            Olympus Scope SN: [id number] was introduced through                            the anus and  advanced to the the cecum, identified                            by appendiceal orifice and ileocecal valve. The                            colonoscopy was performed without difficulty. The                            patient tolerated the procedure well. The quality                            of the bowel preparation was good. The ileocecal                            valve, appendiceal orifice, and rectum were                            photographed. The bowel preparation used was                            Miralax via split dose instruction. Scope In: 1:45:57 PM Scope Out: 1:59:21 PM Scope Withdrawal Time: 0 hours 9 minutes 7 seconds  Total Procedure Duration: 0 hours 13 minutes 24 seconds  Findings:                 The perianal and digital rectal examinations were                            normal.                           Multiple diverticula were found in the sigmoid                            colon.                           The exam was otherwise without abnormality on  direct and retroflexion views. Complications:            No immediate complications. Estimated Blood Loss:     Estimated blood loss: none. Impression:               - Diverticulosis in the sigmoid colon. This may                            have been the source of hematochezia last month.                           - The examination was otherwise normal on direct                            and retroflexion views.                           - No specimens collected.                           - Personal history of colonic polyp. diminutive                            adenoma 2006 Recommendation:           - Patient has a contact number available for                            emergencies. The signs and symptoms of potential                            delayed complications were discussed with the                            patient. Return to normal activities tomorrow.                             Written discharge instructions were provided to the                            patient.                           - Resume previous diet.                           - Continue present medications.                           - Repeat colonoscopy in 10 years. Iva Boop, MD 05/29/2023 2:09:18 PM This report has been signed electronically.

## 2023-05-29 NOTE — Patient Instructions (Addendum)
No polyps or cancer were seen.  You do have diverticulosis - and that may have been the source of your recent bleeding.  Next routine colonoscopy or other screening test in 10 years - 2034.  I appreciate the opportunity to care for you. Iva Boop, MD, [facility]   Recommendation:           - Patient has a contact number available for                            emergencies. The signs and symptoms of potential                            delayed complications were discussed with the                            patient. Return to normal activities tomorrow.                            Written discharge instructions were provided to the                            patient.                           - Resume previous diet.                           - Continue present medications.                           - Repeat colonoscopy in 10 years.   YOU HAD AN ENDOSCOPIC PROCEDURE TODAY AT THE  ENDOSCOPY CENTER:   Refer to the procedure report that was given to you for any specific questions about what was found during the examination.  If the procedure report does not answer your questions, please call your gastroenterologist to clarify.  If you requested that your care partner not be given the details of your procedure findings, then the procedure report has been included in a sealed envelope for you to review at your convenience later.  YOU SHOULD EXPECT: Some feelings of bloating in the abdomen. Passage of more gas than usual.  Walking can help get rid of the air that was put into your GI tract during the procedure and reduce the bloating. If you had a lower endoscopy (such as a colonoscopy or flexible sigmoidoscopy) you may notice spotting of blood in your stool or on the toilet paper. If you underwent a bowel prep for your procedure, you may not have a normal bowel movement for a few days.  Please Note:  You might notice some irritation and congestion in your nose or some drainage.  This is from  the oxygen used during your procedure.  There is no need for concern and it should clear up in a day or so.  SYMPTOMS TO REPORT IMMEDIATELY:  Following lower endoscopy (colonoscopy or flexible sigmoidoscopy):  Excessive amounts of blood in the stool  Significant tenderness or worsening of abdominal pains  Swelling of the abdomen that is new, acute  Fever of 100F or higher  Handout on diverticulosis given.  For urgent or emergent  issues, a gastroenterologist can be reached at any hour by calling [phone]. Do not use MyChart messaging for urgent concerns.    DIET:  We do recommend a small meal at first, but then you may proceed to your regular diet.  Drink plenty of fluids but you should avoid alcoholic beverages for 24 hours.  ACTIVITY:  You should plan to take it easy for the rest of today and you should NOT DRIVE or use heavy machinery until tomorrow (because of the sedation medicines used during the test).    FOLLOW UP: Our staff will call the number listed on your records the next business day following your procedure.  We will call around 7:15- 8:00 am to check on you and address any questions or concerns that you may have regarding the information given to you following your procedure. If we do not reach you, we will leave a message.     If any biopsies were taken you will be contacted by phone or by letter within the next 1-3 weeks.  Please call us at [phone] if you have not heard about the biopsies in 3 weeks.    SIGNATURES/CONFIDENTIALITY: You and/or your care partner have signed paperwork which will be entered into your electronic medical record.  These signatures attest to the fact that that the information above on your After Visit Summary has been reviewed and is understood.  Full responsibility of the confidentiality of this discharge information lies with you and/or your care-partner.

## 2023-06-02 ENCOUNTER — Telehealth: Payer: Self-pay | Admitting: *Deleted

## 2023-06-02 NOTE — Telephone Encounter (Signed)
No answer on  follow up call. Left message.   

## 2023-07-03 ENCOUNTER — Telehealth (INDEPENDENT_AMBULATORY_CARE_PROVIDER_SITE_OTHER): Payer: 59 | Admitting: Nurse Practitioner

## 2023-07-03 ENCOUNTER — Encounter: Payer: Self-pay | Admitting: Nurse Practitioner

## 2023-07-03 VITALS — Ht 58.5 in | Wt 165.0 lb

## 2023-07-03 DIAGNOSIS — U071 COVID-19: Secondary | ICD-10-CM | POA: Diagnosis not present

## 2023-07-03 MED ORDER — NIRMATRELVIR/RITONAVIR (PAXLOVID)TABLET: 3.0000 | ORAL_TABLET | Freq: Two times a day (BID) | ORAL | 0 refills | Status: AC

## 2023-07-03 NOTE — Patient Instructions (Signed)
It was great to see you!  Start paxlovid 3 capsules twice a day for 5 days  Drink plenty of fluids and get rest.   Wear a mask if you need to go out in public, but try to limit this  Let's follow-up of your symptoms worsen or any concerns.   Take care,  Rodman Pickle, NP

## 2023-07-03 NOTE — Assessment & Plan Note (Signed)
Encourage fluids, rest. Start paxlovid BID x5 days. Can take tylenol as needed for body aches or headache.   Reviewed home care instructions for COVID. Advised self-isolation at home until fever free for 24 hours without tylenol or ibuprofen and symptoms are starting to feel better. If symptoms, esp, dyspnea develops/worsens, recommend in-person evaluation at either an urgent care or the emergency room.

## 2023-07-03 NOTE — Progress Notes (Signed)
[facility] [address] [place] [address] Dept: [phone] Dept Fax: [phone]  Virtual Video Visit  I connected with [name] on 07/03/23 at  9:20 AM EDT by a video enabled telemedicine application and verified that I am speaking with the correct person using two identifiers.  Location patient: Home Location provider: Clinic Persons participating in the virtual visit: Patient; Rodman Pickle, NP; Malena Peer, CMA  I discussed the limitations of evaluation and management by telemedicine and the availability of in person appointments. The patient expressed understanding and agreed to proceed.  Chief Complaint  Patient presents with   Covid Positive    Home test positive last night, cough, sore throat, skin hurts, headache    SUBJECTIVE:  HPI: Jo Lamb is a 62 y.o. female who presents with cough, sore throat, headache for 3 days ago and positive home covid test last night.   UPPER RESPIRATORY TRACT INFECTION  Fever:  subjective - chills, hot Cough: yes Shortness of breath: no Wheezing: no Chest pain: no Chest tightness: no Chest congestion: no Nasal congestion: yes Runny nose: yes Post nasal drip: yes Sneezing: yes Sore throat: yes Swollen glands: no Sinus pressure: yes Headache: yes Face pain: yes Toothache: no Ear pain: yes bilateral Ear pressure: no bilateral Eyes red/itching:no Eye drainage/crusting: no  Vomiting: no Rash: no Fatigue: yes Sick contacts: no Strep contacts: no  Context: stable Recurrent sinusitis: no Relief with OTC cold/cough medications:  yes   Treatments attempted: zyrtec, mucinex, tylenol   Patient Active Problem List   Diagnosis Date Noted   COVID-19 07/03/2023   Colon cancer screening 09/18/2022   H/O: pneumonia 06/03/2022   Elevated transaminase level 02/16/2019   Routine general medical examination at a health care facility  05/01/2014   Prediabetes 11/14/2012   BACK PAIN WITH RADICULOPATHY 11/29/2008   Essential hypertension 11/01/2008   Class 1 obesity due to excess calories with body mass index (BMI) of 33.0 to 33.9 in adult 08/11/2008   GERD 08/11/2008   Hyperlipidemia 07/07/2008   NECK PAIN, CHRONIC 07/21/2007   Migraine headache 07/20/2007   IBS 07/20/2007    Past Surgical History:  Procedure Laterality Date   MRI     MRI of head// Negative   TUBAL LIGATION     VAGINAL DELIVERY     x's two    Family History  Problem Relation Age of Onset   Hypertension Father    Colon polyps Neg Hx    Colon cancer Neg Hx    Esophageal cancer Neg Hx    Rectal cancer Neg Hx    Stomach cancer Neg Hx     Social History   Tobacco Use   Smoking status: Never   Smokeless tobacco: Never  Substance Use Topics   Alcohol use: No    Alcohol/week: 0.0 standard drinks of alcohol   Drug use: No     Current Outpatient Medications:    Fluocinolone Acetonide 0.01 % OIL, SMARTSIG:1 Drop(s) In Ear(s) Daily PRN, Disp: , Rfl:    imipramine (TOFRANIL) 25 MG tablet, TAKE 2 TABLETS BY MOUTH AT BEDTIME., Disp: 180 tablet, Rfl: 3   ketoconazole (NIZORAL) 2 % shampoo, Apply 1 Application topically. 2-3x weekly, Disp: , Rfl:    losartan-hydrochlorothiazide (HYZAAR) 50-12.5 MG tablet, Take 1 tablet by mouth daily., Disp: 90 tablet, Rfl: 3   nirmatrelvir/ritonavir (PAXLOVID) 20 x 150 MG & 10 x 100MG  TABS, Take 3 tablets by mouth 2 (two) times daily  for 5 days. (Take nirmatrelvir 150 mg two tablets twice daily for 5 days and ritonavir 100 mg one tablet twice daily for 5 days) Patient GFR is 90, Disp: 30 tablet, Rfl: 0   OVER THE COUNTER MEDICATION, Multivitamin with  fish oil  and milk thistle Tumeric grape seed pine bark etc, Disp: , Rfl:    Turmeric (QC TUMERIC COMPLEX PO), Take by mouth., Disp: , Rfl:   No Known Allergies  ROS: See pertinent positives and negatives per HPI.  OBSERVATIONS/OBJECTIVE:  VITALS per patient  if applicable: Today's Vitals   07/03/23 0922  Weight: 165 lb (74.8 kg)  Height: 4' 10.5" (1.486 m)   Body mass index is 33.9 kg/m.    GENERAL: Alert and oriented. Appears well and in no acute distress.  HEENT: Atraumatic. Conjunctiva clear. No obvious abnormalities on inspection of external nose and ears.  NECK: Normal movements of the head and neck.  LUNGS: On inspection, no signs of respiratory distress. Breathing rate appears normal. No obvious gross SOB, gasping or wheezing, and no conversational dyspnea.  CV: No obvious cyanosis.  MS: Moves all visible extremities without noticeable abnormality.  PSYCH/NEURO: Pleasant and cooperative. No obvious depression or anxiety. Speech and thought processing grossly intact.  ASSESSMENT AND PLAN:  Problem List Items Addressed This Visit       Other   COVID-19 - Primary    Encourage fluids, rest. Start paxlovid BID x5 days. Can take tylenol as needed for body aches or headache.   Reviewed home care instructions for COVID. Advised self-isolation at home until fever free for 24 hours without tylenol or ibuprofen and symptoms are starting to feel better. If symptoms, esp, dyspnea develops/worsens, recommend in-person evaluation at either an urgent care or the emergency room.       Relevant Medications   nirmatrelvir/ritonavir (PAXLOVID) 20 x 150 MG & 10 x 100MG  TABS     I discussed the assessment and treatment plan with the patient. The patient was provided an opportunity to ask questions and all were answered. The patient agreed with the plan and demonstrated an understanding of the instructions.   The patient was advised to call back or seek an in-person evaluation if the symptoms worsen or if the condition fails to improve as anticipated.   Gerre Scull, NP

## 2023-07-29 ENCOUNTER — Other Ambulatory Visit: Payer: Self-pay | Admitting: Family Medicine

## 2023-09-26 ENCOUNTER — Other Ambulatory Visit: Payer: Self-pay | Admitting: Family Medicine

## 2023-10-24 ENCOUNTER — Other Ambulatory Visit: Payer: Self-pay | Admitting: Family Medicine

## 2023-12-23 ENCOUNTER — Other Ambulatory Visit: Payer: Self-pay | Admitting: Family Medicine

## 2023-12-23 NOTE — Telephone Encounter (Signed)
 Pt is due for a f/u on or after 01/06/24, please schedule and then route to me to refill

## 2023-12-24 NOTE — Telephone Encounter (Signed)
 Lvmtcb, sent mychart message

## 2023-12-25 NOTE — Telephone Encounter (Signed)
 lvmtcb

## 2023-12-28 NOTE — Telephone Encounter (Signed)
 Patient scheduled.

## 2023-12-28 NOTE — Telephone Encounter (Signed)
 LVM for patient to c/b and schedule.

## 2024-01-13 ENCOUNTER — Encounter: Payer: Self-pay | Admitting: Family Medicine

## 2024-01-13 ENCOUNTER — Ambulatory Visit: Admitting: Family Medicine

## 2024-01-13 VITALS — BP 132/78 | HR 88 | Temp 97.9°F | Ht 58.27 in | Wt 176.6 lb

## 2024-01-13 DIAGNOSIS — I10 Essential (primary) hypertension: Secondary | ICD-10-CM | POA: Diagnosis not present

## 2024-01-13 DIAGNOSIS — E119 Type 2 diabetes mellitus without complications: Secondary | ICD-10-CM

## 2024-01-13 DIAGNOSIS — G43909 Migraine, unspecified, not intractable, without status migrainosus: Secondary | ICD-10-CM | POA: Diagnosis not present

## 2024-01-13 DIAGNOSIS — Z6836 Body mass index (BMI) 36.0-36.9, adult: Secondary | ICD-10-CM

## 2024-01-13 DIAGNOSIS — E7800 Pure hypercholesterolemia, unspecified: Secondary | ICD-10-CM | POA: Diagnosis not present

## 2024-01-13 DIAGNOSIS — R7303 Prediabetes: Secondary | ICD-10-CM | POA: Diagnosis not present

## 2024-01-13 DIAGNOSIS — E66812 Obesity, class 2: Secondary | ICD-10-CM

## 2024-01-13 LAB — LIPID PANEL
Cholesterol: 226 mg/dL — ABNORMAL HIGH (ref 0–200)
HDL: 45.5 mg/dL (ref >39.00)
LDL Cholesterol: 149 mg/dL — ABNORMAL HIGH (ref 0–99)
NonHDL: 180.46
Total CHOL/HDL Ratio: 5
Triglycerides: 157 mg/dL — ABNORMAL HIGH (ref 0.0–149.0)
VLDL: 31.4 mg/dL (ref 0.0–40.0)

## 2024-01-13 LAB — TSH: TSH: 3.34 u[IU]/mL (ref 0.35–5.50)

## 2024-01-13 LAB — CBC WITH DIFFERENTIAL/PLATELET
Basophils Absolute: 0 10*3/uL (ref 0.0–0.1)
Basophils Relative: 0.4 % (ref 0.0–3.0)
Eosinophils Absolute: 0.2 10*3/uL (ref 0.0–0.7)
Eosinophils Relative: 2.1 % (ref 0.0–5.0)
HCT: 42.3 % (ref 36.0–46.0)
Hemoglobin: 14.4 g/dL (ref 12.0–15.0)
Lymphocytes Relative: 34.7 % (ref 12.0–46.0)
Lymphs Abs: 2.7 10*3/uL (ref 0.7–4.0)
MCHC: 34.2 g/dL (ref 30.0–36.0)
MCV: 90.8 fl (ref 78.0–100.0)
Monocytes Absolute: 0.6 10*3/uL (ref 0.1–1.0)
Monocytes Relative: 7.7 % (ref 3.0–12.0)
Neutro Abs: 4.3 10*3/uL (ref 1.4–7.7)
Neutrophils Relative %: 55.1 % (ref 43.0–77.0)
Platelets: 225 10*3/uL (ref 150.0–400.0)
RBC: 4.66 Mil/uL (ref 3.87–5.11)
RDW: 13.2 % (ref 11.5–15.5)
WBC: 7.9 10*3/uL (ref 4.0–10.5)

## 2024-01-13 LAB — HEMOGLOBIN A1C: Hgb A1c MFr Bld: 7.1 % — ABNORMAL HIGH (ref 4.6–6.5)

## 2024-01-13 LAB — COMPREHENSIVE METABOLIC PANEL WITH GFR
ALT: 67 U/L — ABNORMAL HIGH (ref 0–35)
AST: 46 U/L — ABNORMAL HIGH (ref 0–37)
Albumin: 4.9 g/dL (ref 3.5–5.2)
Alkaline Phosphatase: 81 U/L (ref 39–117)
BUN: 13 mg/dL (ref 6–23)
CO2: 30 meq/L (ref 19–32)
Calcium: 10 mg/dL (ref 8.4–10.5)
Chloride: 100 meq/L (ref 96–112)
Creatinine, Ser: 0.96 mg/dL (ref 0.40–1.20)
GFR: 62.93 mL/min (ref >60.00)
Glucose, Bld: 122 mg/dL — ABNORMAL HIGH (ref 70–99)
Potassium: 4.6 meq/L (ref 3.5–5.1)
Sodium: 139 meq/L (ref 135–145)
Total Bilirubin: 0.5 mg/dL (ref 0.2–1.2)
Total Protein: 7.2 g/dL (ref 6.0–8.3)

## 2024-01-13 NOTE — Progress Notes (Signed)
 Subjective:    Patient ID: Jo Lamb, female    DOB: 1961/04/11, 63 y.o.   MRN: [id number]  HPI  Wt Readings from Last 3 Encounters:  01/13/24 176 lb 9.6 oz (80.1 kg)  07/03/23 165 lb (74.8 kg)  05/29/23 163 lb (73.9 kg)   36.57 kg/m  Vitals:   01/13/24 0920  BP: 132/78  Pulse: 88  Temp: 97.9 F (36.6 C)  SpO2: 96%   Pt presents for follow up of HTN and chronic medical problems including prediabetes and migraines    Recent pollen bothers her  Would rather avoid pharmacology for that  This worsens her headache   Disappointed- gained her weight back  Hit the plateau and got frustrated then - gave up her effort  Is starting to get back to her better habits    bp is stable today  No cp or palpitations or headaches or edema  No side effects to medicines  BP Readings from Last 3 Encounters:  01/13/24 132/78  05/29/23 124/70  01/05/23 132/80    Losartan hct 50-12.5 mg daily    Migraines Tofranil 50 mg at bedtime    Lab Results  Component Value Date   NA 140 01/05/2023   K 4.2 01/05/2023   CO2 30 01/05/2023   GLUCOSE 101 (H) 01/05/2023   BUN 15 01/05/2023   CREATININE 0.97 01/05/2023   CALCIUM 9.9 01/05/2023   GFR 62.60 01/05/2023   GFRNONAA 54 (L) 04/05/2016   Lab Results  Component Value Date   ALT 27 01/05/2023   AST 22 01/05/2023   ALKPHOS 74 01/05/2023   BILITOT 0.5 01/05/2023    Hyperlipidemia Lab Results  Component Value Date   CHOL 234 (H) 01/05/2023   HDL 41.70 01/05/2023   LDLCALC 162 (H) 01/05/2023   LDLDIRECT 155.0 07/30/2021   TRIG 151.0 (H) 01/05/2023   CHOLHDL 6 01/05/2023   Diet control   Prediabetes Lab Results  Component Value Date   HGBA1C 5.9 (A) 01/05/2023   HGBA1C 7.3 (H) 09/09/2022   HGBA1C 6.7 (H) 07/30/2021    Started walking at lunch  Unsweet tea with splenda   Some sweets     Patient Active Problem List   Diagnosis Date Noted   COVID-19 07/03/2023   Colon cancer screening 09/18/2022    H/O: pneumonia 06/03/2022   Elevated transaminase level 02/16/2019   Routine general medical examination at a health care facility 05/01/2014   Prediabetes 11/14/2012   BACK PAIN WITH RADICULOPATHY 11/29/2008   Essential hypertension 11/01/2008   Class 2 obesity due to excess calories in adult 08/11/2008   GERD 08/11/2008   Hyperlipidemia 07/07/2008   NECK PAIN, CHRONIC 07/21/2007   Migraine headache 07/20/2007   IBS 07/20/2007   Past Medical History:  Diagnosis Date   Back pain    Carpal tunnel syndrome on both sides 02/2004// 10/22/2004   Nerve conduction study x's 2   Colon polyps    colonoscopy- polyps, hemorrhoids, divertic   Diabetes mellitus without complication (HCC)    pre-diabetic   Fibroids    History of migraine headaches    Hypertension    White coat   IBS (irritable bowel syndrome)    Neck pain    Past Surgical History:  Procedure Laterality Date   MRI     MRI of head// Negative   TUBAL LIGATION     VAGINAL DELIVERY     x's two   Social History   Tobacco Use   Smoking status:  Never   Smokeless tobacco: Never  Substance Use Topics   Alcohol use: No    Alcohol/week: 0.0 standard drinks of alcohol   Drug use: No   Family History  Problem Relation Age of Onset   Hypertension Father    Colon polyps Neg Hx    Colon cancer Neg Hx    Esophageal cancer Neg Hx    Rectal cancer Neg Hx    Stomach cancer Neg Hx    No Known Allergies Current Outpatient Medications on File Prior to Visit  Medication Sig Dispense Refill   Fluocinolone Acetonide 0.01 % OIL SMARTSIG:1 Drop(s) In Ear(s) Daily PRN     imipramine (TOFRANIL) 25 MG tablet TAKE 2 TABLETS BY MOUTH AT BEDTIME 60 tablet 2   ketoconazole (NIZORAL) 2 % shampoo Apply 1 Application topically. 2-3x weekly     losartan-hydrochlorothiazide (HYZAAR) 50-12.5 MG tablet TAKE 1 TABLET BY MOUTH EVERY DAY 30 tablet 0   OVER THE COUNTER MEDICATION Multivitamin with  fish oil  and milk thistle Tumeric grape seed  pine bark etc     Turmeric (QC TUMERIC COMPLEX PO) Take by mouth.     No current facility-administered medications on file prior to visit.    Review of Systems  Constitutional:  Negative for activity change, appetite change, fatigue, fever and unexpected weight change.  HENT:  Positive for postnasal drip and rhinorrhea. Negative for congestion, ear pain, sinus pressure and sore throat.   Eyes:  Negative for pain, redness and visual disturbance.  Respiratory:  Negative for cough, shortness of breath and wheezing.   Cardiovascular:  Negative for chest pain and palpitations.  Gastrointestinal:  Negative for abdominal pain, blood in stool, constipation and diarrhea.  Endocrine: Negative for polydipsia and polyuria.  Genitourinary:  Negative for dysuria, frequency and urgency.  Musculoskeletal:  Negative for arthralgias, back pain and myalgias.  Skin:  Negative for pallor and rash.  Allergic/Immunologic: Negative for environmental allergies.  Neurological:  Negative for dizziness, syncope and headaches.  Hematological:  Negative for adenopathy. Does not bruise/bleed easily.  Psychiatric/Behavioral:  Positive for dysphoric mood. Negative for decreased concentration. The patient is not nervous/anxious.        Objective:   Physical Exam Constitutional:      General: She is not in acute distress.    Appearance: Normal appearance. She is well-developed. She is obese. She is not ill-appearing or diaphoretic.  HENT:     Head: Normocephalic and atraumatic.  Eyes:     Conjunctiva/sclera: Conjunctivae normal.     Pupils: Pupils are equal, round, and reactive to light.  Neck:     Thyroid: No thyromegaly.     Vascular: No carotid bruit or JVD.  Cardiovascular:     Rate and Rhythm: Normal rate and regular rhythm.     Heart sounds: Normal heart sounds.     No gallop.  Pulmonary:     Effort: Pulmonary effort is normal. No respiratory distress.     Breath sounds: Normal breath sounds. No  wheezing or rales.  Abdominal:     General: There is no distension or abdominal bruit.     Palpations: Abdomen is soft.  Musculoskeletal:     Cervical back: Normal range of motion and neck supple.     Right lower leg: No edema.     Left lower leg: No edema.  Lymphadenopathy:     Cervical: No cervical adenopathy.  Skin:    General: Skin is warm and dry.     Coloration:  Skin is not pale.     Findings: No rash.  Neurological:     Mental Status: She is alert.     Coordination: Coordination normal.     Deep Tendon Reflexes: Reflexes are normal and symmetric. Reflexes normal.  Psychiatric:        Mood and Affect: Mood normal.           Assessment & Plan:   Problem List Items Addressed This Visit       Cardiovascular and Mediastinum   Migraine headache   A little worse during allergy season   Continues imipramine 50 mg at bedtime       Essential hypertension   bp in fair control at this time  BP Readings from Last 1 Encounters:  01/13/24 132/78   No changes needed Most recent labs reviewed  Disc lifstyle change with low sodium diet and exercise  Taking Losartan hct 50-12.5 mg daily   Labs today  Starting to work on health habits and weight loss again       Relevant Orders   TSH   Lipid panel   Comprehensive metabolic panel with GFR   CBC with Differential/Platelet     Other   Prediabetes - Primary   A1c today  Pt has been eating poorly /gained weight back after losing motivation Discussed this in detail   Now starting to walk  Also discussed options for dessert that are fruit based   disc imp of low glycemic diet and wt loss to prevent DM2   Follow up in 4-6 wk for annual exam/ will review      Relevant Orders   Hemoglobin A1c   Hyperlipidemia   Disc goals for lipids and reasons to control them Rev last labs with pt Rev low sat fat diet in detail  Lab today  Diet has been worse  Making effort to change that       Relevant Orders   Lipid  panel   Comprehensive metabolic panel with GFR   Class 2 obesity due to excess calories in adult   Discussed how this problem influences overall health and the risks it imposes  Reviewed plan for weight loss with lower calorie diet (via better food choices (lower glycemic and portion control) along with exercise building up to or more than 30 minutes 5 days per week including some aerobic activity and strength training    Plans to get back on track Discussed plan to make changes gradually

## 2024-01-13 NOTE — Assessment & Plan Note (Signed)
 Discussed how this problem influences overall health and the risks it imposes  Reviewed plan for weight loss with lower calorie diet (via better food choices (lower glycemic and portion control) along with exercise building up to or more than 30 minutes 5 days per week including some aerobic activity and strength training    Plans to get back on track Discussed plan to make changes gradually

## 2024-01-13 NOTE — Assessment & Plan Note (Signed)
 A1c today  Pt has been eating poorly /gained weight back after losing motivation Discussed this in detail   Now starting to walk  Also discussed options for dessert that are fruit based   disc imp of low glycemic diet and wt loss to prevent DM2   Follow up in 4-6 wk for annual exam/ will review

## 2024-01-13 NOTE — Patient Instructions (Addendum)
 Keep walking   Add some strength training to your routine, this is important for bone and brain health and can reduce your risk of falls and help your body use insulin properly and regulate weight  Light weights, exercise bands , and internet videos are a good way to start  Yoga (chair or regular), machines , floor exercises or a gym with machines are also good options    Look for some no sugar added fruit solutions for dessert   Labs today   Follow up in a 4-6 weeks

## 2024-01-13 NOTE — Assessment & Plan Note (Signed)
 bp in fair control at this time  BP Readings from Last 1 Encounters:  01/13/24 132/78   No changes needed Most recent labs reviewed  Disc lifstyle change with low sodium diet and exercise  Taking Losartan hct 50-12.5 mg daily   Labs today  Starting to work on health habits and weight loss again

## 2024-01-13 NOTE — Assessment & Plan Note (Signed)
 Disc goals for lipids and reasons to control them Rev last labs with pt Rev low sat fat diet in detail  Lab today  Diet has been worse  Making effort to change that

## 2024-01-13 NOTE — Assessment & Plan Note (Signed)
 A little worse during allergy season   Continues imipramine 50 mg at bedtime

## 2024-01-21 ENCOUNTER — Other Ambulatory Visit: Payer: Self-pay | Admitting: Family Medicine

## 2024-01-31 ENCOUNTER — Other Ambulatory Visit: Payer: Self-pay | Admitting: Family Medicine

## 2024-02-17 ENCOUNTER — Encounter: Admitting: Family Medicine

## 2024-03-23 ENCOUNTER — Ambulatory Visit (INDEPENDENT_AMBULATORY_CARE_PROVIDER_SITE_OTHER): Admitting: Family Medicine

## 2024-03-23 ENCOUNTER — Encounter: Payer: Self-pay | Admitting: Family Medicine

## 2024-03-23 VITALS — BP 122/80 | HR 69 | Temp 98.0°F | Ht <= 58 in | Wt 167.1 lb

## 2024-03-23 DIAGNOSIS — E66812 Obesity, class 2: Secondary | ICD-10-CM

## 2024-03-23 DIAGNOSIS — E7800 Pure hypercholesterolemia, unspecified: Secondary | ICD-10-CM | POA: Diagnosis not present

## 2024-03-23 DIAGNOSIS — Z0000 Encounter for general adult medical examination without abnormal findings: Secondary | ICD-10-CM | POA: Diagnosis not present

## 2024-03-23 DIAGNOSIS — R7401 Elevation of levels of liver transaminase levels: Secondary | ICD-10-CM

## 2024-03-23 DIAGNOSIS — E119 Type 2 diabetes mellitus without complications: Secondary | ICD-10-CM

## 2024-03-23 DIAGNOSIS — Z6835 Body mass index (BMI) 35.0-35.9, adult: Secondary | ICD-10-CM

## 2024-03-23 DIAGNOSIS — R7303 Prediabetes: Secondary | ICD-10-CM

## 2024-03-23 DIAGNOSIS — I10 Essential (primary) hypertension: Secondary | ICD-10-CM

## 2024-03-23 DIAGNOSIS — K5790 Diverticulosis of intestine, part unspecified, without perforation or abscess without bleeding: Secondary | ICD-10-CM

## 2024-03-23 DIAGNOSIS — K429 Umbilical hernia without obstruction or gangrene: Secondary | ICD-10-CM

## 2024-03-23 DIAGNOSIS — Z1211 Encounter for screening for malignant neoplasm of colon: Secondary | ICD-10-CM

## 2024-03-23 NOTE — Progress Notes (Signed)
 Subjective:    Patient ID: Jo Lamb, female    DOB: June 28, 1961, 63 y.o.   MRN: [id number]  HPI  Here for health maintenance exam and to review chronic medical problems   Wt Readings from Last 3 Encounters:  03/23/24 167 lb 2 oz (75.8 kg)  01/13/24 176 lb 9.6 oz (80.1 kg)  07/03/23 165 lb (74.8 kg)   35.85 kg/m  Vitals:   03/23/24 0956 03/23/24 1032  BP: (!) 142/90 122/80  Pulse: 69   Temp: 98 F (36.7 C)   SpO2: 96%     Immunization History  Administered Date(s) Administered   Influenza Split 10/03/2011, 08/18/2012   Influenza,inj,Quad PF,6+ Mos 06/03/2013, 06/27/2015, 07/14/2016, 11/11/2017, 07/30/2021, 09/18/2022   Tdap 05/09/2014    Health Maintenance Due  Topic Date Due   FOOT EXAM  09/19/2023   Pna vaccine - declines   Tetanus shot is due in August / declines today   Mammogram- declines  Self breast exam- no lumps   Gyn health-has fibroids Needs to follow up with gyn , in [place] Dr [facility]   Declines gyn exam today-will follow up with gyn    Colon cancer screening  colonoscopy 04/2023 with 10 y recall  Bone health   Falls- none  Fractures-none  Supplements -none    Exercise  Some walking  Not motivated to do more/not interested       Mood    01/13/2024    9:22 AM 09/18/2022   10:33 AM 07/30/2021    4:37 PM 07/02/2020   10:37 AM 02/16/2019    9:13 AM  Depression screen PHQ 2/9  Decreased Interest 0 0 0 0 0  Down, Depressed, Hopeless 0 0 0 0 0  PHQ - 2 Score 0 0 0 0 0  Altered sleeping 0 0 1 1   Tired, decreased energy 1 1 0 1   Change in appetite 1 1 0    Feeling bad or failure about yourself  1 0 0 0   Trouble concentrating 0 0 0 0   Moving slowly or fidgety/restless 0 0 0 0   Suicidal thoughts 0 0 0 0   PHQ-9 Score 3 2 1 2    Difficult doing work/chores Not difficult at all        HTN bp is stable today  No cp or palpitations or headaches or edema  No side effects to medicines  BP Readings from Last 3 Encounters:   03/23/24 122/80  01/13/24 132/78  05/29/23 124/70    Losartan  hct 50-12.5 mg daily  This am blood pressure was very good   Lab Results  Component Value Date   NA 139 01/13/2024   K 4.6 01/13/2024   CO2 30 01/13/2024   GLUCOSE 122 (H) 01/13/2024   BUN 13 01/13/2024   CREATININE 0.96 01/13/2024   CALCIUM 10.0 01/13/2024   GFR 62.93 01/13/2024   GFRNONAA 54 (L) 04/05/2016   Lab Results  Component Value Date   ALT 67 (H) 01/13/2024   AST 46 (H) 01/13/2024   ALKPHOS 81 01/13/2024   BILITOT 0.5 01/13/2024   History of elevated transaminases  Went up from last labs    Fibrosis 4 Score = 1.57 (Indeterminate)       Interpretation for patients with NAFLD          <1.30       -  F0-F1 (Low risk)          1.30-2.67 -  Indeterminate           >  2.67      -  F3-F4 (High risk)     Validated for ages 30-65      Hyperlipidemia Lab Results  Component Value Date   CHOL 226 (H) 01/13/2024   CHOL 234 (H) 01/05/2023   CHOL 223 (H) 09/09/2022   Lab Results  Component Value Date   HDL 45.50 01/13/2024   HDL 41.70 01/05/2023   HDL 42.90 09/09/2022   Lab Results  Component Value Date   LDLCALC 149 (H) 01/13/2024   LDLCALC 162 (H) 01/05/2023   LDLCALC 140 (H) 09/09/2022   Lab Results  Component Value Date   TRIG 157.0 (H) 01/13/2024   TRIG 151.0 (H) 01/05/2023   TRIG 199.0 (H) 09/09/2022   Lab Results  Component Value Date   CHOLHDL 5 01/13/2024   CHOLHDL 6 01/05/2023   CHOLHDL 5 09/09/2022   Lab Results  Component Value Date   LDLDIRECT 155.0 07/30/2021   LDLDIRECT 192.0 06/27/2015   LDLDIRECT 145.5 02/16/2013   Prediabetes Lab Results  Component Value Date   HGBA1C 7.1 (H) 01/13/2024   HGBA1C 5.9 (A) 01/05/2023   HGBA1C 7.3 (H) 09/09/2022   Up into the DM range now    Since last labs has lost 10 lb  Really working on it  Intermittent fasting- 2 meals per day -eats between 1 and 7   Is watching what she is eating  Lowered the carbs and sugar     Thinks she has a hernia that bothered her this week (near belly button) Some cramping / left abdomen  Has some diverticulosis  No blood in stool  Eating more seeds  Constipated also     Patient Active Problem List   Diagnosis Date Noted   Diverticulosis 03/23/2024   Umbilical hernia 03/23/2024   Colon cancer screening 09/18/2022   H/O: pneumonia 06/03/2022   Elevated transaminase level 02/16/2019   Routine general medical examination at a health care facility 05/01/2014   Prediabetes 11/14/2012   BACK PAIN WITH RADICULOPATHY 11/29/2008   Essential hypertension 11/01/2008   Class 2 obesity due to excess calories in adult 08/11/2008   GERD 08/11/2008   Hyperlipidemia 07/07/2008   NECK PAIN, CHRONIC 07/21/2007   Migraine headache 07/20/2007   IBS 07/20/2007   Past Medical History:  Diagnosis Date   Allergy    Back pain    Carpal tunnel syndrome on both sides 02/2004// 10/22/2004   Nerve conduction study x's 2   Colon polyps    colonoscopy- polyps, hemorrhoids, divertic   Diabetes mellitus without complication (HCC)    pre-diabetic   Fibroids    History of migraine headaches    Hypertension    White coat   IBS (irritable bowel syndrome)    Neck pain    Past Surgical History:  Procedure Laterality Date   MRI     MRI of head// Negative   TUBAL LIGATION     VAGINAL DELIVERY     x's two   Social History   Tobacco Use   Smoking status: Never   Smokeless tobacco: Never  Substance Use Topics   Alcohol use: No    Alcohol/week: 0.0 standard drinks of alcohol   Drug use: No   Family History  Problem Relation Age of Onset   Hypertension Father    Stroke Father    Varicose Veins Father    Colon polyps Neg Hx    Colon cancer Neg Hx    Esophageal cancer Neg Hx    Rectal  cancer Neg Hx    Stomach cancer Neg Hx    No Known Allergies Current Outpatient Medications on File Prior to Visit  Medication Sig Dispense Refill   imipramine  (TOFRANIL ) 25 MG tablet  TAKE 2 TABLETS BY MOUTH AT BEDTIME 60 tablet 2   ketoconazole (NIZORAL) 2 % shampoo Apply 1 Application topically. 2-3x weekly     losartan -hydrochlorothiazide  (HYZAAR) 50-12.5 MG tablet TAKE 1 TABLET BY MOUTH EVERY DAY 90 tablet 1   Multiple Vitamin (MULTIVITAMIN ADULT PO) Take 1 capsule by mouth daily.     Turmeric (QC TUMERIC COMPLEX PO) Take by mouth.     No current facility-administered medications on file prior to visit.    Review of Systems  Constitutional:  Negative for activity change, appetite change, fatigue, fever and unexpected weight change.  HENT:  Negative for congestion, ear pain, rhinorrhea, sinus pressure and sore throat.   Eyes:  Negative for pain, redness and visual disturbance.  Respiratory:  Negative for cough, shortness of breath and wheezing.   Cardiovascular:  Negative for chest pain and palpitations.  Gastrointestinal:  Positive for constipation. Negative for abdominal pain, blood in stool and diarrhea.       Hernia /umbilical  Endocrine: Negative for polydipsia and polyuria.  Genitourinary:  Negative for dysuria, frequency and urgency.  Musculoskeletal:  Negative for arthralgias, back pain and myalgias.  Skin:  Negative for pallor and rash.  Allergic/Immunologic: Negative for environmental allergies.  Neurological:  Negative for dizziness, syncope and headaches.  Hematological:  Negative for adenopathy. Does not bruise/bleed easily.  Psychiatric/Behavioral:  Negative for decreased concentration and dysphoric mood. The patient is not nervous/anxious.        Objective:   Physical Exam Constitutional:      General: She is not in acute distress.    Appearance: Normal appearance. She is well-developed. She is not ill-appearing or diaphoretic.  HENT:     Head: Normocephalic and atraumatic.     Right Ear: Tympanic membrane, ear canal and external ear normal.     Left Ear: Tympanic membrane, ear canal and external ear normal.     Nose: Nose normal. No  congestion.     Mouth/Throat:     Mouth: Mucous membranes are moist.     Pharynx: Oropharynx is clear. No posterior oropharyngeal erythema.   Eyes:     General: No scleral icterus.    Extraocular Movements: Extraocular movements intact.     Conjunctiva/sclera: Conjunctivae normal.     Pupils: Pupils are equal, round, and reactive to light.   Neck:     Thyroid : No thyromegaly.     Vascular: No carotid bruit or JVD.   Cardiovascular:     Rate and Rhythm: Normal rate and regular rhythm.     Pulses: Normal pulses.     Heart sounds: Normal heart sounds.     No gallop.  Pulmonary:     Effort: Pulmonary effort is normal. No respiratory distress.     Breath sounds: Normal breath sounds. No wheezing.     Comments: Good air exch Chest:     Chest wall: No tenderness.  Abdominal:     General: Bowel sounds are normal. There is no distension or abdominal bruit.     Palpations: Abdomen is soft. There is no mass.     Tenderness: There is no abdominal tenderness.     Hernia: No hernia is present.     Comments: Umbilical hernia  Small  Reducible Non tender   No suprapubic tenderness  or fullness    Genitourinary:    Comments: Declines breast exam Plans to follow up with gyn for pelvic exam  Musculoskeletal:        General: No tenderness. Normal range of motion.     Cervical back: Normal range of motion and neck supple. No rigidity. No muscular tenderness.     Right lower leg: No edema.     Left lower leg: No edema.     Comments: No kyphosis   Lymphadenopathy:     Cervical: No cervical adenopathy.   Skin:    General: Skin is warm and dry.     Coloration: Skin is not pale.     Findings: No erythema or rash.     Comments: Solar lentigines diffusely    Neurological:     Mental Status: She is alert. Mental status is at baseline.     Cranial Nerves: No cranial nerve deficit.     Motor: No abnormal muscle tone.     Coordination: Coordination normal.     Gait: Gait normal.      Deep Tendon Reflexes: Reflexes are normal and symmetric. Reflexes normal.   Psychiatric:        Mood and Affect: Mood normal.        Cognition and Memory: Cognition and memory normal.           Assessment & Plan:   Problem List Items Addressed This Visit       Cardiovascular and Mediastinum   Essential hypertension   bp in fair control at this time  BP Readings from Last 1 Encounters:  03/23/24 122/80   No changes needed Most recent labs reviewed  Disc lifstyle change with low sodium diet and exercise  Taking Losartan  hct 50-12.5 mg daily   Commended lifestyle change         Digestive   Diverticulosis   Occational left sided cramping in low abd Reviewed colonoscopy from aug   Discussed intake of nuts and seeds and what to limit  Also encouraged follow up with gyn        Other   Umbilical hernia   Bothersome after weight gain Not painful  Is reducible  Pt declines surgical referral at this time  Call back and Er precautions noted in detail today         Routine general medical examination at a health care facility - Primary   Reviewed health habits including diet and exercise and skin cancer prevention Reviewed appropriate screening tests for age  Also reviewed health mt list, fam hx and immunization status , as well as social and family history   See HPI Labs reviewed and ordered Health Maintenance  Topic Date Due   Complete foot exam   09/19/2023   Zoster (Shingles) Vaccine (1 of 2) 06/23/2024*   Pap with HPV screening  03/23/2025*   Pneumococcal Vaccination (1 of 2 - PCV) 03/23/2025*   Mammogram  03/23/2025*   COVID-19 Vaccine (1 - 2024-25 season) 04/08/2026*   Yearly kidney health urinalysis for diabetes  01/05/2028*   Hepatitis C Screening  02/15/2029*   HIV Screening  02/15/2029*   Flu Shot  04/29/2024   DTaP/Tdap/Td vaccine (2 - Td or Tdap) 05/09/2024   Hemoglobin A1C  07/14/2024   Yearly kidney function blood test for diabetes   01/12/2025   Colon Cancer Screening  05/28/2033   Hepatitis B Vaccine  Aged Out   HPV Vaccine  Aged Out   Meningitis B Vaccine  Aged  Out   Eye exam for diabetics  Discontinued  *Topic was postponed. The date shown is not the original due date.    Declines breast cancer screen Plans to follow up with gyn in [place] - has ? History of fibroids and declined surgery  Discussed fall prevention, supplements and exercise for bone density  PHQ 3 due to some fatigue -watching this and follow up planned       Prediabetes   Lab Results  Component Value Date   HGBA1C 7.1 (H) 01/13/2024   HGBA1C 5.9 (A) 01/05/2023   HGBA1C 7.3 (H) 09/09/2022   Has lost 9 lb Working on low glycemic diet  Follow up after mid July with lab and plan -will discuss diabetes in detail      Hyperlipidemia   Disc goals for lipids and reasons to control them Rev last labs with pt Rev low sat fat diet in detail  Working on diet  Re check after mid July   In setting of new DM2 will discuss statin       Elevated transaminase level   Lab Results  Component Value Date   ALT 67 (H) 01/13/2024   AST 46 (H) 01/13/2024   ALKPHOS 81 01/13/2024   BILITOT 0.5 01/13/2024   Likely fatty liver  Re check planned after mid July  Has lost 9 lb Commended       Colon cancer screening   Colonoscopy 04/2023  Utd  10 y recall      Class 2 obesity due to excess calories in adult   Commended on 9 lb weight loss Discussed how this problem influences overall health and the risks it imposes  Reviewed plan for weight loss with lower calorie diet (via better food choices (lower glycemic and portion control) along with exercise building up to or more than 30 minutes 5 days per week including some aerobic activity and strength training          Other Visit Diagnoses       Type 2 diabetes mellitus without complication, without long-term current use of insulin (HCC)

## 2024-03-23 NOTE — Assessment & Plan Note (Signed)
 Commended on 9 lb weight loss Discussed how this problem influences overall health and the risks it imposes  Reviewed plan for weight loss with lower calorie diet (via better food choices (lower glycemic and portion control) along with exercise building up to or more than 30 minutes 5 days per week including some aerobic activity and strength training

## 2024-03-23 NOTE — Assessment & Plan Note (Signed)
 Lab Results  Component Value Date   ALT 67 (H) 01/13/2024   AST 46 (H) 01/13/2024   ALKPHOS 81 01/13/2024   BILITOT 0.5 01/13/2024   Likely fatty liver  Re check planned after mid July  Has lost 9 lb Commended

## 2024-03-23 NOTE — Assessment & Plan Note (Signed)
 Reviewed health habits including diet and exercise and skin cancer prevention Reviewed appropriate screening tests for age  Also reviewed health mt list, fam hx and immunization status , as well as social and family history   See HPI Labs reviewed and ordered Health Maintenance  Topic Date Due   Complete foot exam   09/19/2023   Zoster (Shingles) Vaccine (1 of 2) 06/23/2024*   Pap with HPV screening  03/23/2025*   Pneumococcal Vaccination (1 of 2 - PCV) 03/23/2025*   Mammogram  03/23/2025*   COVID-19 Vaccine (1 - 2024-25 season) 04/08/2026*   Yearly kidney health urinalysis for diabetes  01/05/2028*   Hepatitis C Screening  02/15/2029*   HIV Screening  02/15/2029*   Flu Shot  04/29/2024   DTaP/Tdap/Td vaccine (2 - Td or Tdap) 05/09/2024   Hemoglobin A1C  07/14/2024   Yearly kidney function blood test for diabetes  01/12/2025   Colon Cancer Screening  05/28/2033   Hepatitis B Vaccine  Aged Out   HPV Vaccine  Aged Out   Meningitis B Vaccine  Aged Out   Eye exam for diabetics  Discontinued  *Topic was postponed. The date shown is not the original due date.    Declines breast cancer screen Plans to follow up with gyn in [place] - has ? History of fibroids and declined surgery  Discussed fall prevention, supplements and exercise for bone density  PHQ 3 due to some fatigue -watching this and follow up planned

## 2024-03-23 NOTE — Assessment & Plan Note (Signed)
 Bothersome after weight gain Not painful  Is reducible  Pt declines surgical referral at this time  Call back and Er precautions noted in detail today

## 2024-03-23 NOTE — Assessment & Plan Note (Addendum)
 bp in fair control at this time  BP Readings from Last 1 Encounters:  03/23/24 122/80   No changes needed Most recent labs reviewed  Disc lifstyle change with low sodium diet and exercise  Taking Losartan  hct 50-12.5 mg daily   Commended lifestyle change

## 2024-03-23 NOTE — Assessment & Plan Note (Signed)
 Disc goals for lipids and reasons to control them Rev last labs with pt Rev low sat fat diet in detail  Working on diet  Re check after mid July   In setting of new DM2 will discuss statin

## 2024-03-23 NOTE — Patient Instructions (Addendum)
 Try to get 1200-1500 mg of calcium per day with at least 2000 iu of vitamin D - for bone health (divide it am and pm) If calcium constipates you then just take the vitamin D   Keep walking  Add some strength training to your routine, this is important for bone and brain health and can reduce your risk of falls and help your body use insulin properly and regulate weight  Light weights, exercise bands , and internet videos are a good way to start  Yoga (chair or regular), machines , floor exercises or a gym with machines are also good options     Start a fiber supplement if not already  Drink lots of water  Miralax is good for constipation  For diverticulosis- avoid nuts and seeds and popcorn if they bother you     Try to get most of your carbohydrates from produce (with the exception of white potatoes) and whole grains Eat less bread/pasta/rice/snack foods/cereals/sweets and other items from the middle of the grocery store (processed carbs)    Get back to the gyn for your visit    If the abdominal cramping worsens let us  know  If the umbilical hernia worsens we will need to refer you to a surgeon    Follow up after mid July with fasting labs piror

## 2024-03-23 NOTE — Assessment & Plan Note (Signed)
 Occational left sided cramping in low abd Reviewed colonoscopy from aug   Discussed intake of nuts and seeds and what to limit  Also encouraged follow up with gyn

## 2024-03-23 NOTE — Assessment & Plan Note (Signed)
 Lab Results  Component Value Date   HGBA1C 7.1 (H) 01/13/2024   HGBA1C 5.9 (A) 01/05/2023   HGBA1C 7.3 (H) 09/09/2022   Has lost 9 lb Working on low glycemic diet  Follow up after mid July with lab and plan -will discuss diabetes in detail

## 2024-03-23 NOTE — Assessment & Plan Note (Signed)
 Colonoscopy 04/2023  Utd  10 y recall

## 2024-04-24 ENCOUNTER — Other Ambulatory Visit: Payer: Self-pay | Admitting: Family Medicine

## 2024-05-08 ENCOUNTER — Telehealth: Payer: Self-pay | Admitting: Family Medicine

## 2024-05-08 DIAGNOSIS — R7303 Prediabetes: Secondary | ICD-10-CM

## 2024-05-08 DIAGNOSIS — I10 Essential (primary) hypertension: Secondary | ICD-10-CM

## 2024-05-08 DIAGNOSIS — E7800 Pure hypercholesterolemia, unspecified: Secondary | ICD-10-CM

## 2024-05-08 NOTE — Telephone Encounter (Signed)
-----   Message from Harlene Du sent at 04/22/2024  2:49 PM EDT ----- Regarding: Lab Mon 05/09/24 Hello,   Patient has a lab appointment on Monday 05/09/24. Can we get lab orders please.   Thanks

## 2024-05-09 ENCOUNTER — Ambulatory Visit: Payer: Self-pay | Admitting: Family Medicine

## 2024-05-09 ENCOUNTER — Other Ambulatory Visit (INDEPENDENT_AMBULATORY_CARE_PROVIDER_SITE_OTHER)

## 2024-05-09 DIAGNOSIS — I10 Essential (primary) hypertension: Secondary | ICD-10-CM | POA: Diagnosis not present

## 2024-05-09 DIAGNOSIS — R7303 Prediabetes: Secondary | ICD-10-CM | POA: Diagnosis not present

## 2024-05-09 DIAGNOSIS — E7800 Pure hypercholesterolemia, unspecified: Secondary | ICD-10-CM | POA: Diagnosis not present

## 2024-05-09 LAB — HEMOGLOBIN A1C: Hgb A1c MFr Bld: 6.4 % (ref 4.6–6.5)

## 2024-05-09 LAB — LIPID PANEL
Cholesterol: 177 mg/dL (ref 0–200)
HDL: 42.1 mg/dL (ref >39.00)
LDL Cholesterol: 103 mg/dL — ABNORMAL HIGH (ref 0–99)
NonHDL: 134.78
Total CHOL/HDL Ratio: 4
Triglycerides: 160 mg/dL — ABNORMAL HIGH (ref 0.0–149.0)
VLDL: 32 mg/dL (ref 0.0–40.0)

## 2024-05-09 LAB — COMPREHENSIVE METABOLIC PANEL WITH GFR
ALT: 38 U/L — ABNORMAL HIGH (ref 0–35)
AST: 28 U/L (ref 0–37)
Albumin: 4.7 g/dL (ref 3.5–5.2)
Alkaline Phosphatase: 65 U/L (ref 39–117)
BUN: 10 mg/dL (ref 6–23)
CO2: 29 meq/L (ref 19–32)
Calcium: 9.4 mg/dL (ref 8.4–10.5)
Chloride: 101 meq/L (ref 96–112)
Creatinine, Ser: 0.95 mg/dL (ref 0.40–1.20)
GFR: 63.58 mL/min (ref >60.00)
Glucose, Bld: 104 mg/dL — ABNORMAL HIGH (ref 70–99)
Potassium: 4.1 meq/L (ref 3.5–5.1)
Sodium: 141 meq/L (ref 135–145)
Total Bilirubin: 0.5 mg/dL (ref 0.2–1.2)
Total Protein: 6.7 g/dL (ref 6.0–8.3)

## 2024-05-09 LAB — MICROALBUMIN / CREATININE URINE RATIO
Creatinine,U: 107.9 mg/dL
Microalb Creat Ratio: UNDETERMINED mg/g (ref 0.0–30.0)
Microalb, Ur: <0.7 mg/dL

## 2024-05-16 ENCOUNTER — Ambulatory Visit: Admitting: Family Medicine

## 2024-05-16 ENCOUNTER — Encounter: Payer: Self-pay | Admitting: Family Medicine

## 2024-05-16 VITALS — BP 128/82 | HR 73 | Temp 98.1°F | Ht <= 58 in | Wt 160.0 lb

## 2024-05-16 DIAGNOSIS — I10 Essential (primary) hypertension: Secondary | ICD-10-CM | POA: Diagnosis not present

## 2024-05-16 DIAGNOSIS — R7303 Prediabetes: Secondary | ICD-10-CM | POA: Diagnosis not present

## 2024-05-16 DIAGNOSIS — E7800 Pure hypercholesterolemia, unspecified: Secondary | ICD-10-CM

## 2024-05-16 DIAGNOSIS — E66811 Obesity, class 1: Secondary | ICD-10-CM | POA: Diagnosis not present

## 2024-05-16 DIAGNOSIS — Z6834 Body mass index (BMI) 34.0-34.9, adult: Secondary | ICD-10-CM

## 2024-05-16 DIAGNOSIS — E6609 Other obesity due to excess calories: Secondary | ICD-10-CM

## 2024-05-16 DIAGNOSIS — R7401 Elevation of levels of liver transaminase levels: Secondary | ICD-10-CM

## 2024-05-16 NOTE — Assessment & Plan Note (Signed)
 Improved with weight loss significantly  Lab Results  Component Value Date   ALT 38 (H) 05/09/2024   AST 28 05/09/2024   ALKPHOS 65 05/09/2024   BILITOT 0.5 05/09/2024    Commended  Fibrosis 4 Score = 1.27 (Low risk)        Interpretation for patients with NAFLD          <1.30       -  F0-F1 (Low risk)          1.30-2.67 -  Indeterminate           >2.67      -  F3-F4 (High risk)     Validated for ages 66-65       Will re check 3 mo

## 2024-05-16 NOTE — Assessment & Plan Note (Addendum)
 bp in fair control at this time  BP Readings from Last 1 Encounters:  05/16/24 128/82   Evenlower at home  No changes needed Most recent labs reviewed  Disc lifstyle change with low sodium diet and exercise  Taking Losartan  hct 50-12.5 mg daily   Commended lifestyle change so far   Follow up in 3 months

## 2024-05-16 NOTE — Patient Instructions (Signed)
 Great job so far on lifestyle change  Keep working on things to do for strength building exercise   Add some strength training to your routine, this is important for bone and brain health and can reduce your risk of falls and help your body use insulin properly and regulate weight  Light weights, exercise bands , and internet videos are a good way to start  Yoga (chair or regular), machines , floor exercises or a gym with machines are also good options     Follow up in 3 months with fasting labs prior

## 2024-05-16 NOTE — Assessment & Plan Note (Signed)
 Making great progress with healthier eating and intermittent fasting  Discussed how this problem influences overall health and the risks it imposes  Reviewed plan for weight loss with lower calorie diet (via better food choices (lower glycemic and portion control) along with exercise building up to or more than 30 minutes 5 days per week including some aerobic activity and strength training    Encouraged more strength training when able (has shoulder issues, encouraged core and leg work)

## 2024-05-16 NOTE — Progress Notes (Signed)
 Subjective:    Patient ID: Jo Lamb, female    DOB: 1960-11-29, 63 y.o.   MRN: [id number]  HPI  Wt Readings from Last 3 Encounters:  05/16/24 160 lb (72.6 kg)  03/23/24 167 lb 2 oz (75.8 kg)  01/13/24 176 lb 9.6 oz (80.1 kg)   34.32 kg/m  Vitals:   05/16/24 0916 05/16/24 0945  BP: (!) 146/88 128/82  Pulse: 73   Temp: 98.1 F (36.7 C)   SpO2: 98%     Pt presents for follow up of  Elevated glucose  HTN  Hyperlipidemia Elevated liver tests  Obesity   Working really hard on lifestyle change  Doing some intermittent fasting  Weight is down 7 lb   Feel great supplement from [employer]  Helps with carbs and sugars  Supposed to help with energy  One is a tea- she drinks when fasting  Other one is before meals (fiber)     HTN bp is stable today  No cp or palpitations or headaches or edema  No side effects to medicines  BP Readings from Last 3 Encounters:  05/16/24 128/82  03/23/24 122/80  01/13/24 132/78    Blood pressure has been great at home / has been well controlled   116/71 sat  124/77 06-02-24   Father died of fatty liver disease  Fibrosis 4 Score = 1.27 (Low risk)        Interpretation for patients with NAFLD          <1.30       -  F0-F1 (Low risk)          1.30-2.67 -  Indeterminate           >2.67      -  F3-F4 (High risk)     Validated for ages 72-65         Lab Results  Component Value Date   NA 141 05/09/2024   K 4.1 05/09/2024   CO2 29 05/09/2024   GLUCOSE 104 (H) 05/09/2024   BUN 10 05/09/2024   CREATININE 0.95 05/09/2024   CALCIUM 9.4 05/09/2024   GFR 63.58 05/09/2024   GFRNONAA 54 (L) 04/05/2016   Losartan  hct 50-12.5 mg daily   Glucose Lab Results  Component Value Date   HGBA1C 6.4 05/09/2024   HGBA1C 7.1 (H) 01/13/2024   HGBA1C 5.9 (A) 01/05/2023   Improved -in pre diabetic range   Eating lean protein Lot of veggies  Getting carbs from veggies    Elevated transaminases Lab Results  Component Value  Date   ALT 38 (H) 05/09/2024   AST 28 05/09/2024   ALKPHOS 65 05/09/2024   BILITOT 0.5 05/09/2024   Improved ALT and AST with weight loss    Hyperlipidemia Lab Results  Component Value Date   CHOL 177 05/09/2024   CHOL 226 (H) 01/13/2024   CHOL 234 (H) 01/05/2023   Lab Results  Component Value Date   HDL 42.10 05/09/2024   HDL 45.50 01/13/2024   HDL 41.70 01/05/2023   Lab Results  Component Value Date   LDLCALC 103 (H) 05/09/2024   LDLCALC 149 (H) 01/13/2024   LDLCALC 162 (H) 01/05/2023   Lab Results  Component Value Date   TRIG 160.0 (H) 05/09/2024   TRIG 157.0 (H) 01/13/2024   TRIG 151.0 (H) 01/05/2023   Lab Results  Component Value Date   CHOLHDL 4 05/09/2024   CHOLHDL 5 01/13/2024   CHOLHDL 6 01/05/2023   Lab Results  Component  Value Date   LDLDIRECT 155.0 07/30/2021   LDLDIRECT 192.0 06/27/2015   LDLDIRECT 145.5 02/16/2013      Patient Active Problem List   Diagnosis Date Noted   Diverticulosis 03/23/2024   Umbilical hernia 03/23/2024   Colon cancer screening 09/18/2022   H/O: pneumonia 06/03/2022   Elevated transaminase level 02/16/2019   Routine general medical examination at a health care facility 05/01/2014   Prediabetes 11/14/2012   BACK PAIN WITH RADICULOPATHY 11/29/2008   Essential hypertension 11/01/2008   Class 1 obesity due to excess calories in adult 08/11/2008   GERD 08/11/2008   Hyperlipidemia 07/07/2008   NECK PAIN, CHRONIC 07/21/2007   Migraine headache 07/20/2007   IBS 07/20/2007   Past Medical History:  Diagnosis Date   Allergy    Back pain    Carpal tunnel syndrome on both sides 02/2004// 10/22/2004   Nerve conduction study x's 2   Colon polyps    colonoscopy- polyps, hemorrhoids, divertic   Diabetes mellitus without complication (HCC)    pre-diabetic   Fibroids    History of migraine headaches    Hypertension    White coat   IBS (irritable bowel syndrome)    Neck pain    Past Surgical History:  Procedure  Laterality Date   MRI     MRI of head// Negative   TUBAL LIGATION     VAGINAL DELIVERY     x's two   Social History   Tobacco Use   Smoking status: Never   Smokeless tobacco: Never  Substance Use Topics   Alcohol use: No    Alcohol/week: 0.0 standard drinks of alcohol   Drug use: No   Family History  Problem Relation Age of Onset   Hypertension Father    Stroke Father    Varicose Veins Father    Colon polyps Neg Hx    Colon cancer Neg Hx    Esophageal cancer Neg Hx    Rectal cancer Neg Hx    Stomach cancer Neg Hx    No Known Allergies Current Outpatient Medications on File Prior to Visit  Medication Sig Dispense Refill   imipramine  (TOFRANIL ) 25 MG tablet TAKE 2 TABLETS BY MOUTH AT BEDTIME 60 tablet 2   ketoconazole (NIZORAL) 2 % shampoo Apply 1 Application topically. 2-3x weekly     losartan -hydrochlorothiazide  (HYZAAR) 50-12.5 MG tablet TAKE 1 TABLET BY MOUTH EVERY DAY 90 tablet 1   Multiple Vitamin (MULTIVITAMIN ADULT PO) Take 1 capsule by mouth daily.     Turmeric (QC TUMERIC COMPLEX PO) Take by mouth.     No current facility-administered medications on file prior to visit.    Review of Systems  Constitutional:  Negative for activity change, appetite change, fatigue, fever and unexpected weight change.  HENT:  Negative for congestion, ear pain, rhinorrhea, sinus pressure and sore throat.   Eyes:  Negative for pain, redness and visual disturbance.  Respiratory:  Negative for cough, shortness of breath and wheezing.   Cardiovascular:  Negative for chest pain and palpitations.  Gastrointestinal:  Negative for abdominal pain, blood in stool, constipation and diarrhea.  Endocrine: Negative for polydipsia and polyuria.  Genitourinary:  Negative for dysuria, frequency and urgency.  Musculoskeletal:  Negative for arthralgias, back pain and myalgias.  Skin:  Negative for pallor and rash.  Allergic/Immunologic: Negative for environmental allergies.  Neurological:   Negative for dizziness, syncope and headaches.  Hematological:  Negative for adenopathy. Does not bruise/bleed easily.  Psychiatric/Behavioral:  Negative for decreased concentration and dysphoric  mood. The patient is not nervous/anxious.        Objective:   Physical Exam Constitutional:      General: She is not in acute distress.    Appearance: Normal appearance. She is well-developed. She is obese. She is not ill-appearing or diaphoretic.  HENT:     Head: Normocephalic and atraumatic.  Eyes:     Conjunctiva/sclera: Conjunctivae normal.     Pupils: Pupils are equal, round, and reactive to light.  Neck:     Thyroid : No thyromegaly.     Vascular: No carotid bruit or JVD.  Cardiovascular:     Rate and Rhythm: Normal rate and regular rhythm.     Heart sounds: Normal heart sounds.     No gallop.  Pulmonary:     Effort: Pulmonary effort is normal. No respiratory distress.     Breath sounds: Normal breath sounds. No wheezing or rales.  Abdominal:     General: There is no distension or abdominal bruit.     Palpations: Abdomen is soft.  Musculoskeletal:     Cervical back: Normal range of motion and neck supple.     Right lower leg: No edema.     Left lower leg: No edema.  Lymphadenopathy:     Cervical: No cervical adenopathy.  Skin:    General: Skin is warm and dry.     Coloration: Skin is not pale.     Findings: No rash.  Neurological:     Mental Status: She is alert.     Coordination: Coordination normal.     Deep Tendon Reflexes: Reflexes are normal and symmetric. Reflexes normal.  Psychiatric:        Mood and Affect: Mood normal.           Assessment & Plan:   Problem List Items Addressed This Visit       Cardiovascular and Mediastinum   Essential hypertension   bp in fair control at this time  BP Readings from Last 1 Encounters:  05/16/24 128/82   Evenlower at home  No changes needed Most recent labs reviewed  Disc lifstyle change with low sodium diet and  exercise  Taking Losartan  hct 50-12.5 mg daily   Commended lifestyle change so far   Follow up in 3 months        Relevant Orders   Basic metabolic panel with GFR     Other   Prediabetes - Primary   Improved Lab Results  Component Value Date   HGBA1C 6.4 05/09/2024   HGBA1C 7.1 (H) 01/13/2024   HGBA1C 5.9 (A) 01/05/2023   Not in the  diabetic range Weight loss and better diet helping  Plans to keep working on this Encouraged more strength building exercise also   Follow up 3 mo      Relevant Orders   Hemoglobin A1c   Hyperlipidemia   Much improvement with diet  Disc goals for lipids and reasons to control them Rev last labs with pt Rev low sat fat diet in detail LDL down to 103  Will keep working on this   Encouraged less beef  Re check 3 months       Relevant Orders   Hepatic function panel   Lipid panel   Elevated transaminase level   Improved with weight loss significantly  Lab Results  Component Value Date   ALT 38 (H) 05/09/2024   AST 28 05/09/2024   ALKPHOS 65 05/09/2024   BILITOT 0.5 05/09/2024    Commended  Fibrosis 4 Score = 1.27 (Low risk)        Interpretation for patients with NAFLD          <1.30       -  F0-F1 (Low risk)          1.30-2.67 -  Indeterminate           >2.67      -  F3-F4 (High risk)     Validated for ages 38-65       Will re check 3 mo      Relevant Orders   Hepatic function panel   Class 1 obesity due to excess calories in adult   Making great progress with healthier eating and intermittent fasting  Discussed how this problem influences overall health and the risks it imposes  Reviewed plan for weight loss with lower calorie diet (via better food choices (lower glycemic and portion control) along with exercise building up to or more than 30 minutes 5 days per week including some aerobic activity and strength training    Encouraged more strength training when able (has shoulder issues, encouraged core and leg  work)

## 2024-05-16 NOTE — Assessment & Plan Note (Signed)
 Much improvement with diet  Disc goals for lipids and reasons to control them Rev last labs with pt Rev low sat fat diet in detail LDL down to 103  Will keep working on this   Encouraged less beef  Re check 3 months

## 2024-05-16 NOTE — Assessment & Plan Note (Signed)
 Improved Lab Results  Component Value Date   HGBA1C 6.4 05/09/2024   HGBA1C 7.1 (H) 01/13/2024   HGBA1C 5.9 (A) 01/05/2023   Not in the  diabetic range Weight loss and better diet helping  Plans to keep working on this Encouraged more strength building exercise also   Follow up 3 mo

## 2024-07-30 ENCOUNTER — Other Ambulatory Visit: Payer: Self-pay | Admitting: Family Medicine

## 2024-08-09 ENCOUNTER — Ambulatory Visit: Payer: Self-pay | Admitting: Family Medicine

## 2024-08-09 ENCOUNTER — Other Ambulatory Visit (INDEPENDENT_AMBULATORY_CARE_PROVIDER_SITE_OTHER)

## 2024-08-09 DIAGNOSIS — R7401 Elevation of levels of liver transaminase levels: Secondary | ICD-10-CM | POA: Diagnosis not present

## 2024-08-09 DIAGNOSIS — I10 Essential (primary) hypertension: Secondary | ICD-10-CM

## 2024-08-09 DIAGNOSIS — E7800 Pure hypercholesterolemia, unspecified: Secondary | ICD-10-CM

## 2024-08-09 DIAGNOSIS — R7303 Prediabetes: Secondary | ICD-10-CM

## 2024-08-09 LAB — BASIC METABOLIC PANEL WITH GFR
BUN: 15 mg/dL (ref 6–23)
CO2: 32 meq/L (ref 19–32)
Calcium: 9.5 mg/dL (ref 8.4–10.5)
Chloride: 101 meq/L (ref 96–112)
Creatinine, Ser: 0.9 mg/dL (ref 0.40–1.20)
GFR: 67.73 mL/min (ref >60.00)
Glucose, Bld: 98 mg/dL (ref 70–99)
Potassium: 4.3 meq/L (ref 3.5–5.1)
Sodium: 140 meq/L (ref 135–145)

## 2024-08-09 LAB — HEPATIC FUNCTION PANEL
ALT: 25 U/L (ref 0–35)
AST: 24 U/L (ref 0–37)
Albumin: 4.6 g/dL (ref 3.5–5.2)
Alkaline Phosphatase: 66 U/L (ref 39–117)
Bilirubin, Direct: 0 mg/dL (ref 0.0–0.3)
Total Bilirubin: 0.5 mg/dL (ref 0.2–1.2)
Total Protein: 7.3 g/dL (ref 6.0–8.3)

## 2024-08-09 LAB — LIPID PANEL
Cholesterol: 176 mg/dL (ref 0–200)
HDL: 42.8 mg/dL (ref >39.00)
LDL Cholesterol: 106 mg/dL — ABNORMAL HIGH (ref 0–99)
NonHDL: 133.44
Total CHOL/HDL Ratio: 4
Triglycerides: 139 mg/dL (ref 0.0–149.0)
VLDL: 27.8 mg/dL (ref 0.0–40.0)

## 2024-08-09 LAB — HEMOGLOBIN A1C: Hgb A1c MFr Bld: 6 % (ref 4.6–6.5)

## 2024-08-12 ENCOUNTER — Other Ambulatory Visit: Payer: Self-pay | Admitting: Family Medicine

## 2024-08-12 NOTE — Telephone Encounter (Signed)
 Last filled on 04/25/24 #60 tab/ 2 refills   Next OV is on 08/16/24

## 2024-08-16 ENCOUNTER — Encounter: Payer: Self-pay | Admitting: Family Medicine

## 2024-08-16 ENCOUNTER — Ambulatory Visit: Admitting: Family Medicine

## 2024-08-16 VITALS — BP 132/78 | HR 67 | Temp 97.7°F | Ht <= 58 in | Wt 150.4 lb

## 2024-08-16 DIAGNOSIS — I10 Essential (primary) hypertension: Secondary | ICD-10-CM | POA: Diagnosis not present

## 2024-08-16 DIAGNOSIS — E6609 Other obesity due to excess calories: Secondary | ICD-10-CM

## 2024-08-16 DIAGNOSIS — E7800 Pure hypercholesterolemia, unspecified: Secondary | ICD-10-CM

## 2024-08-16 DIAGNOSIS — Z6834 Body mass index (BMI) 34.0-34.9, adult: Secondary | ICD-10-CM

## 2024-08-16 DIAGNOSIS — E66811 Obesity, class 1: Secondary | ICD-10-CM | POA: Diagnosis not present

## 2024-08-16 DIAGNOSIS — R7303 Prediabetes: Secondary | ICD-10-CM

## 2024-08-16 DIAGNOSIS — R7401 Elevation of levels of liver transaminase levels: Secondary | ICD-10-CM

## 2024-08-16 NOTE — Patient Instructions (Signed)
 Keep up the good work with healthy eating   Add more exercise when you can Add some strength training to your routine, this is important for bone and brain health and can reduce your risk of falls and help your body use insulin properly and regulate weight  Light weights, exercise bands , and internet videos are a good way to start  Yoga (chair or regular), machines , floor exercises or a gym with machines are also good options    Take care of yourself    Follow up in 3 months

## 2024-08-16 NOTE — Progress Notes (Signed)
 Subjective:    Patient ID: Jo Lamb, female    DOB: [birth year], 63 y.o.   MRN: [id number]  HPI  Wt Readings from Last 3 Encounters:  08/16/24 150 lb 6 oz (68.2 kg)  05/16/24 160 lb (72.6 kg)  03/23/24 167 lb 2 oz (75.8 kg)   31.98 kg/m  Vitals:   08/16/24 0952 08/16/24 1016  BP: (!) 140/78 132/78  Pulse: 67   Temp: 97.7 F (36.5 C)   SpO2: 98%     Pt presents for follow up of  HTN Prediabetes Hyperlipidemia Elevated lfts   Has done great with weight loss   Walking Ready to start using some weights     HTN bp is stable today  No cp or palpitations or headaches or edema  No side effects to medicines  BP Readings from Last 3 Encounters:  08/16/24 132/78  05/16/24 128/82  03/23/24 122/80     Lab Results  Component Value Date   NA 140 08/09/2024   K 4.3 08/09/2024   CO2 32 08/09/2024   GLUCOSE 98 08/09/2024   BUN 15 08/09/2024   CREATININE 0.90 08/09/2024   CALCIUM 9.5 08/09/2024   GFR 67.73 08/09/2024   GFRNONAA 54 (L) 04/05/2016   Losartan  hct 50-12.5 mg daily  Prediabetes Lab Results  Component Value Date   HGBA1C 6.0 08/09/2024   HGBA1C 6.4 05/09/2024   HGBA1C 7.1 (H) 01/13/2024   Watching diet and weight loss has helped   Less carbs overall Some veggie carbs   Eats out less  Less coffee drinks    Elevated transaminases Lab Results  Component Value Date   ALT 25 08/09/2024   AST 24 08/09/2024   ALKPHOS 66 08/09/2024   BILITOT 0.5 08/09/2024   Improved with weight loss   Fibrosis 4 Score = 1.37 (Indeterminate)       Interpretation for patients with NAFLD          <1.30       -  F0-F1 (Low risk)          1.30-2.67 -  Indeterminate           >2.67      -  F3-F4 (High risk)     Validated for ages 72-65      Score is based on outdated labs. ALT, AST, and platelets should all be measured within the last 6 months for an accurate FIB-4 Score  Hyperlipidemia Lab Results  Component Value Date   CHOL 176 08/09/2024    CHOL 177 05/09/2024   CHOL 226 (H) 01/13/2024   Lab Results  Component Value Date   HDL 42.80 08/09/2024   HDL 42.10 05/09/2024   HDL 45.50 01/13/2024   Lab Results  Component Value Date   LDLCALC 106 (H) 08/09/2024   LDLCALC 103 (H) 05/09/2024   LDLCALC 149 (H) 01/13/2024   Lab Results  Component Value Date   TRIG 139.0 08/09/2024   TRIG 160.0 (H) 05/09/2024   TRIG 157.0 (H) 01/13/2024   Lab Results  Component Value Date   CHOLHDL 4 08/09/2024   CHOLHDL 4 05/09/2024   CHOLHDL 5 01/13/2024   Lab Results  Component Value Date   LDLDIRECT 155.0 07/30/2021   LDLDIRECT 192.0 06/27/2015   LDLDIRECT 145.5 02/16/2013     Patient Active Problem List   Diagnosis Date Noted   Diverticulosis 03/23/2024   Umbilical hernia 03/23/2024   Colon cancer screening 09/18/2022   H/O: pneumonia 06/03/2022   Elevated transaminase  level 02/16/2019   Routine general medical examination at a health care facility 05/01/2014   Prediabetes 11/14/2012   BACK PAIN WITH RADICULOPATHY 11/29/2008   Essential hypertension 11/01/2008   Class 1 obesity due to excess calories in adult 08/11/2008   GERD 08/11/2008   Hyperlipidemia 07/07/2008   NECK PAIN, CHRONIC 07/21/2007   Migraine headache 07/20/2007   IBS 07/20/2007   Past Medical History:  Diagnosis Date   Allergy    Back pain    Carpal tunnel syndrome on both sides 02/2004// 10/22/2004   Nerve conduction study x's 2   Colon polyps    colonoscopy- polyps, hemorrhoids, divertic   Diabetes mellitus without complication (HCC)    pre-diabetic   Fibroids    History of migraine headaches    Hypertension    White coat   IBS (irritable bowel syndrome)    Neck pain    Past Surgical History:  Procedure Laterality Date   MRI     MRI of head// Negative   TUBAL LIGATION     VAGINAL DELIVERY     x's two   Social History   Tobacco Use   Smoking status: Never   Smokeless tobacco: Never  Substance Use Topics   Alcohol use: No     Alcohol/week: 0.0 standard drinks of alcohol   Drug use: No   Family History  Problem Relation Age of Onset   Hypertension Father    Stroke Father    Varicose Veins Father    Colon polyps Neg Hx    Colon cancer Neg Hx    Esophageal cancer Neg Hx    Rectal cancer Neg Hx    Stomach cancer Neg Hx    No Known Allergies Current Outpatient Medications on File Prior to Visit  Medication Sig Dispense Refill   imipramine  (TOFRANIL ) 25 MG tablet TAKE 2 TABLETS BY MOUTH AT BEDTIME 180 tablet 1   ketoconazole (NIZORAL) 2 % shampoo Apply 1 Application topically. 2-3x weekly     losartan -hydrochlorothiazide  (HYZAAR) 50-12.5 MG tablet TAKE 1 TABLET BY MOUTH EVERY DAY 90 tablet 0   Multiple Vitamin (MULTIVITAMIN ADULT PO) Take 1 capsule by mouth daily.     Turmeric (QC TUMERIC COMPLEX PO) Take by mouth.     No current facility-administered medications on file prior to visit.    Review of Systems  Constitutional:  Negative for activity change, appetite change, fatigue, fever and unexpected weight change.  HENT:  Negative for congestion, ear pain, rhinorrhea, sinus pressure and sore throat.   Eyes:  Negative for pain, redness and visual disturbance.  Respiratory:  Negative for cough, shortness of breath and wheezing.   Cardiovascular:  Negative for chest pain and palpitations.  Gastrointestinal:  Negative for abdominal pain, blood in stool, constipation and diarrhea.  Endocrine: Negative for polydipsia and polyuria.  Genitourinary:  Negative for dysuria, frequency and urgency.  Musculoskeletal:  Negative for arthralgias, back pain and myalgias.  Skin:  Negative for pallor and rash.  Allergic/Immunologic: Negative for environmental allergies.  Neurological:  Negative for dizziness, syncope and headaches.  Hematological:  Negative for adenopathy. Does not bruise/bleed easily.  Psychiatric/Behavioral:  Negative for decreased concentration and dysphoric mood. The patient is not nervous/anxious.         Objective:   Physical Exam Constitutional:      General: She is not in acute distress.    Appearance: Normal appearance. She is well-developed. She is obese. She is not ill-appearing or diaphoretic.  HENT:  Head: Normocephalic and atraumatic.  Eyes:     Conjunctiva/sclera: Conjunctivae normal.     Pupils: Pupils are equal, round, and reactive to light.  Neck:     Thyroid : No thyromegaly.     Vascular: No carotid bruit or JVD.  Cardiovascular:     Rate and Rhythm: Normal rate and regular rhythm.     Heart sounds: Normal heart sounds.     No gallop.  Pulmonary:     Effort: Pulmonary effort is normal. No respiratory distress.     Breath sounds: Normal breath sounds. No wheezing or rales.  Abdominal:     General: There is no distension or abdominal bruit.     Palpations: Abdomen is soft.  Musculoskeletal:     Cervical back: Normal range of motion and neck supple.     Right lower leg: No edema.     Left lower leg: No edema.  Lymphadenopathy:     Cervical: No cervical adenopathy.  Skin:    General: Skin is warm and dry.     Coloration: Skin is not pale.     Findings: No rash.  Neurological:     Mental Status: She is alert.     Coordination: Coordination normal.     Deep Tendon Reflexes: Reflexes are normal and symmetric. Reflexes normal.  Psychiatric:        Mood and Affect: Mood normal.           Assessment & Plan:   Problem List Items Addressed This Visit       Cardiovascular and Mediastinum   Essential hypertension   bp in fair control at this time  BP Readings from Last 1 Encounters:  08/16/24 132/78   Evenlower at home  No changes needed Most recent labs reviewed  Disc lifstyle change with low sodium diet and exercise  Taking Losartan  hct 50-12.5 mg daily   Commended lifestyle change so far   Follow up in 3 months          Other   Prediabetes - Primary   Lab Results  Component Value Date   HGBA1C 6.0 08/09/2024   HGBA1C 6.4  05/09/2024   HGBA1C 7.1 (H) 01/13/2024   Commended on good with with diet /exercise  Continues to loose weight   Follow up 3 mo       Hyperlipidemia   Much improvement with diet  Disc goals for lipids and reasons to control them Rev last labs with pt Rev low sat fat diet in detail LDL fairly stable at 106 Trig down with better blood sugar -commended  Exercise will help HDL Will keep working on this   Encouraged less beef  Re check 3 months       Elevated transaminase level   Improved farther with weight loss  Lab Results  Component Value Date   ALT 25 08/09/2024   AST 24 08/09/2024   ALKPHOS 66 08/09/2024   BILITOT 0.5 08/09/2024    Plans to keep working on it  Follow up 3 months       Class 1 obesity due to excess calories in adult   Discussed how this problem influences overall health and the risks it imposes  Reviewed plan for weight loss with lower calorie diet (via better food choices (lower glycemic and portion control) along with exercise building up to or more than 30 minutes 5 days per week including some aerobic activity and strength training   Commended good work so far  Encouraged to  add some strength buliding exercise   Bmi down to 31.9

## 2024-08-16 NOTE — Assessment & Plan Note (Signed)
 Improved farther with weight loss  Lab Results  Component Value Date   ALT 25 08/09/2024   AST 24 08/09/2024   ALKPHOS 66 08/09/2024   BILITOT 0.5 08/09/2024    Plans to keep working on it  Follow up 3 months

## 2024-08-16 NOTE — Assessment & Plan Note (Addendum)
 Discussed how this problem influences overall health and the risks it imposes  Reviewed plan for weight loss with lower calorie diet (via better food choices (lower glycemic and portion control) along with exercise building up to or more than 30 minutes 5 days per week including some aerobic activity and strength training   Commended good work so far  Encouraged to add some strength buliding exercise   Bmi down to 31.9

## 2024-08-16 NOTE — Assessment & Plan Note (Signed)
 bp in fair control at this time  BP Readings from Last 1 Encounters:  08/16/24 132/78   Evenlower at home  No changes needed Most recent labs reviewed  Disc lifstyle change with low sodium diet and exercise  Taking Losartan  hct 50-12.5 mg daily   Commended lifestyle change so far   Follow up in 3 months

## 2024-08-16 NOTE — Assessment & Plan Note (Signed)
 Lab Results  Component Value Date   HGBA1C 6.0 08/09/2024   HGBA1C 6.4 05/09/2024   HGBA1C 7.1 (H) 01/13/2024   Commended on good with with diet /exercise  Continues to loose weight   Follow up 3 mo

## 2024-08-16 NOTE — Assessment & Plan Note (Signed)
 Much improvement with diet  Disc goals for lipids and reasons to control them Rev last labs with pt Rev low sat fat diet in detail LDL fairly stable at 106 Trig down with better blood sugar -commended  Exercise will help HDL Will keep working on this   Encouraged less beef  Re check 3 months

## 2024-11-16 ENCOUNTER — Ambulatory Visit: Admitting: Family Medicine

## 2024-12-22 ENCOUNTER — Ambulatory Visit: Admitting: Family Medicine
